# Patient Record
Sex: Male | Born: 1945 | Race: Black or African American | Hispanic: No | Marital: Married | Smoking: Former smoker
Health system: Southern US, Community
[De-identification: ages and names within clinical notes are randomized; demographics above are authoritative.]

## PROBLEM LIST (undated history)

## (undated) DIAGNOSIS — E119 Type 2 diabetes mellitus without complications: Secondary | ICD-10-CM

## (undated) DIAGNOSIS — E785 Hyperlipidemia, unspecified: Secondary | ICD-10-CM

## (undated) DIAGNOSIS — K922 Gastrointestinal hemorrhage, unspecified: Secondary | ICD-10-CM

## (undated) DIAGNOSIS — R569 Unspecified convulsions: Secondary | ICD-10-CM

## (undated) DIAGNOSIS — D72819 Decreased white blood cell count, unspecified: Secondary | ICD-10-CM

## (undated) DIAGNOSIS — D709 Neutropenia, unspecified: Secondary | ICD-10-CM

## (undated) DIAGNOSIS — K635 Polyp of colon: Secondary | ICD-10-CM

## (undated) DIAGNOSIS — D649 Anemia, unspecified: Secondary | ICD-10-CM

## (undated) DIAGNOSIS — I10 Essential (primary) hypertension: Secondary | ICD-10-CM

## (undated) DIAGNOSIS — K219 Gastro-esophageal reflux disease without esophagitis: Secondary | ICD-10-CM

## (undated) DIAGNOSIS — G43909 Migraine, unspecified, not intractable, without status migrainosus: Secondary | ICD-10-CM

## (undated) HISTORY — DX: Migraine, unspecified, not intractable, without status migrainosus: G43.909

## (undated) HISTORY — DX: Hyperlipidemia, unspecified: E78.5

## (undated) HISTORY — PX: NO PAST SURGERIES: SHX2092

## (undated) HISTORY — DX: Neutropenia, unspecified: D70.9

## (undated) HISTORY — DX: Type 2 diabetes mellitus without complications: E11.9

## (undated) HISTORY — DX: Unspecified convulsions: R56.9

## (undated) HISTORY — DX: Polyp of colon: K63.5

## (undated) HISTORY — DX: Anemia, unspecified: D64.9

## (undated) HISTORY — DX: Essential (primary) hypertension: I10

## (undated) HISTORY — DX: Gastro-esophageal reflux disease without esophagitis: K21.9

## (undated) HISTORY — DX: Decreased white blood cell count, unspecified: D72.819

## (undated) HISTORY — DX: Gastrointestinal hemorrhage, unspecified: K92.2

---

## 2006-07-06 ENCOUNTER — Ambulatory Visit: Payer: Self-pay | Admitting: Gastroenterology

## 2006-09-26 ENCOUNTER — Emergency Department: Payer: Self-pay | Admitting: Emergency Medicine

## 2012-01-26 ENCOUNTER — Ambulatory Visit: Payer: Self-pay | Admitting: Gastroenterology

## 2012-04-05 ENCOUNTER — Emergency Department: Payer: Self-pay | Admitting: Emergency Medicine

## 2012-04-05 LAB — CK TOTAL AND CKMB (NOT AT ARMC)
CK, Total: 113 U/L (ref 35–232)
CK-MB: 0.7 ng/mL (ref 0.5–3.6)

## 2012-04-05 LAB — COMPREHENSIVE METABOLIC PANEL
Albumin: 4.1 g/dL (ref 3.4–5.0)
Anion Gap: 8 (ref 7–16)
BUN: 11 mg/dL (ref 7–18)
Bilirubin,Total: 0.4 mg/dL (ref 0.2–1.0)
Calcium, Total: 9 mg/dL (ref 8.5–10.1)
Chloride: 100 mmol/L (ref 98–107)
Co2: 29 mmol/L (ref 21–32)
EGFR (Non-African Amer.): >60
Glucose: 207 mg/dL — ABNORMAL HIGH (ref 65–99)
Osmolality: 279 (ref 275–301)
SGOT(AST): 20 U/L (ref 15–37)
Sodium: 137 mmol/L (ref 136–145)
Total Protein: 7.7 g/dL (ref 6.4–8.2)

## 2012-04-05 LAB — URINALYSIS, COMPLETE
Bacteria: NONE SEEN
Bilirubin,UR: NEGATIVE
Glucose,UR: 150 mg/dL (ref 0–75)
Protein: 100
Squamous Epithelial: NONE SEEN

## 2012-04-05 LAB — CBC
HGB: 12.8 g/dL — ABNORMAL LOW (ref 13.0–18.0)
MCH: 27.9 pg (ref 26.0–34.0)
MCV: 85 fL (ref 80–100)
Platelet: 239 10*3/uL (ref 150–440)
WBC: 4.1 10*3/uL (ref 3.8–10.6)

## 2012-06-24 ENCOUNTER — Ambulatory Visit: Payer: Self-pay | Admitting: Internal Medicine

## 2012-10-25 ENCOUNTER — Emergency Department: Payer: Self-pay | Admitting: Emergency Medicine

## 2012-10-25 LAB — COMPREHENSIVE METABOLIC PANEL
Alkaline Phosphatase: 87 U/L (ref 50–136)
BUN: 14 mg/dL (ref 7–18)
Calcium, Total: 9.2 mg/dL (ref 8.5–10.1)
Chloride: 106 mmol/L (ref 98–107)
Co2: 26 mmol/L (ref 21–32)
Creatinine: 0.88 mg/dL (ref 0.60–1.30)
EGFR (African American): >60
EGFR (Non-African Amer.): >60
Potassium: 3.9 mmol/L (ref 3.5–5.1)
SGOT(AST): 30 U/L (ref 15–37)
SGPT (ALT): 34 U/L (ref 12–78)

## 2012-10-25 LAB — URINALYSIS, COMPLETE
Bilirubin,UR: NEGATIVE
Ketone: NEGATIVE
Leukocyte Esterase: NEGATIVE
Nitrite: NEGATIVE
Ph: 6 (ref 4.5–8.0)
RBC,UR: <1 /HPF (ref 0–5)
Squamous Epithelial: NONE SEEN
WBC UR: 2 /HPF (ref 0–5)

## 2012-10-25 LAB — CK TOTAL AND CKMB (NOT AT ARMC)
CK, Total: 158 U/L (ref 35–232)
CK-MB: 0.6 ng/mL (ref 0.5–3.6)

## 2012-10-25 LAB — CBC
HCT: 39.1 % — ABNORMAL LOW (ref 40.0–52.0)
HGB: 13.1 g/dL (ref 13.0–18.0)
MCV: 84 fL (ref 80–100)
Platelet: 273 10*3/uL (ref 150–440)
RDW: 13.1 % (ref 11.5–14.5)

## 2012-11-06 ENCOUNTER — Ambulatory Visit: Payer: Self-pay | Admitting: Neurology

## 2012-11-06 LAB — CREATININE, SERUM
Creatinine: 1 mg/dL (ref 0.60–1.30)
EGFR (African American): >60
EGFR (Non-African Amer.): >60

## 2014-02-13 DIAGNOSIS — I10 Essential (primary) hypertension: Secondary | ICD-10-CM | POA: Insufficient documentation

## 2014-02-13 DIAGNOSIS — K635 Polyp of colon: Secondary | ICD-10-CM | POA: Insufficient documentation

## 2014-02-13 DIAGNOSIS — E7800 Pure hypercholesterolemia, unspecified: Secondary | ICD-10-CM | POA: Insufficient documentation

## 2014-02-13 DIAGNOSIS — K219 Gastro-esophageal reflux disease without esophagitis: Secondary | ICD-10-CM | POA: Insufficient documentation

## 2014-02-13 DIAGNOSIS — G43909 Migraine, unspecified, not intractable, without status migrainosus: Secondary | ICD-10-CM | POA: Insufficient documentation

## 2014-02-13 DIAGNOSIS — D649 Anemia, unspecified: Secondary | ICD-10-CM | POA: Insufficient documentation

## 2014-02-13 DIAGNOSIS — E119 Type 2 diabetes mellitus without complications: Secondary | ICD-10-CM | POA: Insufficient documentation

## 2014-12-26 ENCOUNTER — Ambulatory Visit: Payer: Self-pay | Admitting: Internal Medicine

## 2015-01-09 ENCOUNTER — Ambulatory Visit
Admit: 2015-01-09 | Disposition: A | Discharge status: Home / Self Care | Payer: Self-pay | Attending: Internal Medicine | Admitting: Internal Medicine

## 2015-02-09 ENCOUNTER — Ambulatory Visit
Admit: 2015-02-09 | Disposition: A | Discharge status: Home / Self Care | Payer: Self-pay | Attending: Internal Medicine | Admitting: Internal Medicine

## 2015-05-10 ENCOUNTER — Other Ambulatory Visit: Payer: Self-pay

## 2015-05-10 DIAGNOSIS — D72819 Decreased white blood cell count, unspecified: Secondary | ICD-10-CM

## 2015-05-11 ENCOUNTER — Inpatient Hospital Stay: Payer: Medicare Other | Attending: Internal Medicine

## 2015-09-11 ENCOUNTER — Inpatient Hospital Stay: Payer: Medicare Other | Attending: Internal Medicine

## 2015-12-14 DIAGNOSIS — F0280 Dementia in other diseases classified elsewhere without behavioral disturbance: Secondary | ICD-10-CM | POA: Insufficient documentation

## 2015-12-14 DIAGNOSIS — G301 Alzheimer's disease with late onset: Secondary | ICD-10-CM

## 2016-01-04 ENCOUNTER — Encounter: Payer: Self-pay | Admitting: *Deleted

## 2016-01-04 ENCOUNTER — Other Ambulatory Visit: Payer: Self-pay | Admitting: *Deleted

## 2016-01-04 DIAGNOSIS — D72819 Decreased white blood cell count, unspecified: Secondary | ICD-10-CM

## 2016-01-09 ENCOUNTER — Inpatient Hospital Stay: Payer: Medicare Other | Admitting: Internal Medicine

## 2016-01-09 ENCOUNTER — Ambulatory Visit: Payer: Self-pay | Admitting: Internal Medicine

## 2016-01-09 ENCOUNTER — Inpatient Hospital Stay: Payer: Medicare Other

## 2016-01-18 ENCOUNTER — Other Ambulatory Visit: Payer: Self-pay | Admitting: Internal Medicine

## 2016-01-18 DIAGNOSIS — R634 Abnormal weight loss: Secondary | ICD-10-CM

## 2016-01-24 ENCOUNTER — Ambulatory Visit: Payer: Medicare Other | Admitting: Internal Medicine

## 2016-01-24 ENCOUNTER — Other Ambulatory Visit: Payer: Medicare Other

## 2016-01-29 ENCOUNTER — Ambulatory Visit: Admission: RE | Admit: 2016-01-29 | Payer: Medicare Other | Source: Ambulatory Visit

## 2016-01-29 ENCOUNTER — Ambulatory Visit
Admission: RE | Admit: 2016-01-29 | Discharge: 2016-01-29 | Disposition: A | Discharge status: Home / Self Care | Payer: Medicare Other | Source: Ambulatory Visit | Attending: Internal Medicine | Admitting: Internal Medicine

## 2016-01-29 DIAGNOSIS — K5730 Diverticulosis of large intestine without perforation or abscess without bleeding: Secondary | ICD-10-CM | POA: Diagnosis not present

## 2016-01-29 DIAGNOSIS — K760 Fatty (change of) liver, not elsewhere classified: Secondary | ICD-10-CM | POA: Diagnosis not present

## 2016-01-29 DIAGNOSIS — R634 Abnormal weight loss: Secondary | ICD-10-CM | POA: Insufficient documentation

## 2016-01-29 MED ORDER — IOPAMIDOL (ISOVUE-300) INJECTION 61%
100.0000 mL | Freq: Once | INTRAVENOUS | Status: AC | PRN
  Administered 2016-01-29: 100 mL via INTRAVENOUS

## 2016-07-09 ENCOUNTER — Ambulatory Visit: Payer: Medicare Other | Attending: Specialist

## 2016-07-09 DIAGNOSIS — G4733 Obstructive sleep apnea (adult) (pediatric): Secondary | ICD-10-CM | POA: Diagnosis not present

## 2016-07-09 DIAGNOSIS — R400 Somnolence: Secondary | ICD-10-CM | POA: Diagnosis present

## 2016-07-09 DIAGNOSIS — G4761 Periodic limb movement disorder: Secondary | ICD-10-CM | POA: Insufficient documentation

## 2017-01-16 ENCOUNTER — Encounter: Payer: Self-pay | Admitting: Dietician

## 2017-01-16 ENCOUNTER — Encounter: Payer: Medicare Other | Attending: Neurology | Admitting: Dietician

## 2017-01-16 VITALS — Ht 72.0 in | Wt 176.2 lb

## 2017-01-16 DIAGNOSIS — R569 Unspecified convulsions: Secondary | ICD-10-CM | POA: Insufficient documentation

## 2017-01-16 DIAGNOSIS — E119 Type 2 diabetes mellitus without complications: Secondary | ICD-10-CM | POA: Insufficient documentation

## 2017-01-16 DIAGNOSIS — K922 Gastrointestinal hemorrhage, unspecified: Secondary | ICD-10-CM | POA: Insufficient documentation

## 2017-01-16 DIAGNOSIS — Z713 Dietary counseling and surveillance: Secondary | ICD-10-CM | POA: Diagnosis not present

## 2017-01-16 NOTE — Patient Instructions (Signed)
   Try Mrs. Dash seasonings on meats or other foods.   Use menus and food lists to plan balanced meals. Include some protein food + a small amount of a starchy food + a vegetable with each meal.

## 2017-01-16 NOTE — Progress Notes (Signed)
Medical Nutrition Therapy: Visit start time: 1315  end time: 1430  Assessment:  Diagnosis: diabetes Past medical history: HTN, gout Psychosocial issues/ stress concerns: memory loss, possible Alzheimer's disease Preferred learning method:  . Auditory  Current weight: 176.2lbs with shoes  Height: 6'0" Medications, supplements: reconciled list in medical record.  Progress and evaluation: wife reports BGs increased significantly since 10/2016. She also reports that he had cortisone injections in both shoulders around that time. She is testing his BGs daily, and reports some readings as high as 350mg /dl. Patient has lost about 11lbs in the past several months. Wife states that he sleeps almost constantly and wakes up only to eat, but does not like many foods that he used to eat. Patient did become drowsy during today's visit.    Physical activity: none  Dietary Intake:  Usual eating pattern includes 2 meals and 2 snacks per day. Dining out frequency: 7 meals per week.  Breakfast: sugar free jello and ritz peanut butter crackers (package) with meds Snack: none Lunch: fried eggs and chicken sausage, oatmeal, sometimes cranberry sauce Snack: Boost drink, 2 sugar free cookies Supper: grilled or baked chicken or baked fish, slaw (won't eat green beans wife brings home).Sometimes beets. KW, or [employer], or [employer] Snack: sometimes eats fried chicken during the night if he wakes up (then BG higher the next morning) Beverages: water,diet ginger ale, sugar free canberry juice  Nutrition Care Education: Topics covered: diabetes, basic nutrition Basic nutrition: basic food groups, appropriate nutrient balance, appropriate meal and snack schedule, general nutrition guidelines    Diabetes: appropriate meal and snack schedule, appropriate carb intake and balance, importance of protein sources. Instructed on basic meal planning using plate method and menus. Wrote menus based on patient's current intake,  advised adequate carbohydrate as well as protein with every meal. Advised eating every 3-5 hours.     Nutritional Diagnosis:  [place]-2.2 Altered nutrition-related laboratory As related to diabetes.  As evidenced by hyperglycemia, spouse report.  Intervention: Instruction as noted above.   Set goals for balanced meals, encouraged use of sample menus to plan meals.    Patient declined follow-up at this time, but wife will work to [employer] Corporationimplement menu options and schedule later if needed.   Education Materials given:  . General diet guidelines for Diabetes . Plate Planner with food lists . Sample meal pattern/ menus . Goals/ instructions  Learner/ who was taught:  . Patient  . Spouse/ partner  Level of understanding: Marland Kitchen. Verbalizes/ demonstrates competency (wife)  Demonstrated degree of understanding via:   Teach back Learning barriers: Memory loss  Willingness to learn/ readiness for change: . Eager, change in progress  Monitoring and Evaluation:  Dietary intake, BG control, and body weight      follow up: prn

## 2017-02-12 ENCOUNTER — Telehealth: Payer: Self-pay | Admitting: Dietician

## 2017-02-12 NOTE — Telephone Encounter (Signed)
Called patient and wife to check on his progress, with preventing further weight loss and adequate nutrition. Left voicemail message requesting a call back.

## 2017-02-19 ENCOUNTER — Encounter: Payer: Self-pay | Admitting: Dietician

## 2017-02-19 NOTE — Progress Notes (Signed)
Have not at this time heard back from Mr. or Mrs. Newstrom since phone message was left on 02/12/17. No follow up appointment was scheduled at his initial visit.

## 2017-03-08 ENCOUNTER — Inpatient Hospital Stay: Payer: Medicare Other

## 2017-03-08 ENCOUNTER — Emergency Department: Payer: Medicare Other

## 2017-03-08 ENCOUNTER — Encounter: Payer: Self-pay | Admitting: Radiology

## 2017-03-08 ENCOUNTER — Inpatient Hospital Stay
Admission: EM | Admit: 2017-03-08 | Discharge: 2017-03-10 | DRG: 176 | Disposition: A | Discharge status: Home Health | Payer: Medicare Other | Attending: Internal Medicine | Admitting: Internal Medicine

## 2017-03-08 DIAGNOSIS — R1011 Right upper quadrant pain: Secondary | ICD-10-CM | POA: Diagnosis present

## 2017-03-08 DIAGNOSIS — E785 Hyperlipidemia, unspecified: Secondary | ICD-10-CM | POA: Diagnosis present

## 2017-03-08 DIAGNOSIS — Z79899 Other long term (current) drug therapy: Secondary | ICD-10-CM | POA: Diagnosis not present

## 2017-03-08 DIAGNOSIS — I10 Essential (primary) hypertension: Secondary | ICD-10-CM | POA: Diagnosis present

## 2017-03-08 DIAGNOSIS — N400 Enlarged prostate without lower urinary tract symptoms: Secondary | ICD-10-CM | POA: Diagnosis present

## 2017-03-08 DIAGNOSIS — N3000 Acute cystitis without hematuria: Secondary | ICD-10-CM | POA: Diagnosis present

## 2017-03-08 DIAGNOSIS — Z87891 Personal history of nicotine dependence: Secondary | ICD-10-CM

## 2017-03-08 DIAGNOSIS — R079 Chest pain, unspecified: Secondary | ICD-10-CM

## 2017-03-08 DIAGNOSIS — Z7982 Long term (current) use of aspirin: Secondary | ICD-10-CM | POA: Diagnosis not present

## 2017-03-08 DIAGNOSIS — F0390 Unspecified dementia without behavioral disturbance: Secondary | ICD-10-CM | POA: Diagnosis present

## 2017-03-08 DIAGNOSIS — I2699 Other pulmonary embolism without acute cor pulmonale: Secondary | ICD-10-CM | POA: Diagnosis not present

## 2017-03-08 DIAGNOSIS — E119 Type 2 diabetes mellitus without complications: Secondary | ICD-10-CM | POA: Diagnosis present

## 2017-03-08 DIAGNOSIS — I471 Supraventricular tachycardia: Secondary | ICD-10-CM | POA: Diagnosis not present

## 2017-03-08 DIAGNOSIS — Z86010 Personal history of colonic polyps: Secondary | ICD-10-CM

## 2017-03-08 DIAGNOSIS — G40909 Epilepsy, unspecified, not intractable, without status epilepticus: Secondary | ICD-10-CM | POA: Diagnosis present

## 2017-03-08 DIAGNOSIS — Z833 Family history of diabetes mellitus: Secondary | ICD-10-CM | POA: Diagnosis not present

## 2017-03-08 DIAGNOSIS — Z7984 Long term (current) use of oral hypoglycemic drugs: Secondary | ICD-10-CM

## 2017-03-08 DIAGNOSIS — K219 Gastro-esophageal reflux disease without esophagitis: Secondary | ICD-10-CM | POA: Diagnosis present

## 2017-03-08 DIAGNOSIS — Z791 Long term (current) use of non-steroidal anti-inflammatories (NSAID): Secondary | ICD-10-CM

## 2017-03-08 DIAGNOSIS — D649 Anemia, unspecified: Secondary | ICD-10-CM | POA: Diagnosis present

## 2017-03-08 LAB — CBC
HEMATOCRIT: 29.8 % — AB (ref 40.0–52.0)
HEMOGLOBIN: 10 g/dL — AB (ref 13.0–18.0)
MCH: 28.8 pg (ref 26.0–34.0)
MCHC: 33.7 g/dL (ref 32.0–36.0)
MCV: 85.5 fL (ref 80.0–100.0)
Platelets: 345 10*3/uL (ref 150–440)
RBC: 3.49 MIL/uL — ABNORMAL LOW (ref 4.40–5.90)
RDW: 13.7 % (ref 11.5–14.5)
WBC: 11.3 10*3/uL — ABNORMAL HIGH (ref 3.8–10.6)

## 2017-03-08 LAB — COMPREHENSIVE METABOLIC PANEL
ALBUMIN: 3.8 g/dL (ref 3.5–5.0)
ALK PHOS: 59 U/L (ref 38–126)
ALT: 16 U/L — ABNORMAL LOW (ref 17–63)
ANION GAP: 12 (ref 5–15)
AST: 21 U/L (ref 15–41)
BUN: 13 mg/dL (ref 6–20)
CALCIUM: 9.3 mg/dL (ref 8.9–10.3)
CHLORIDE: 107 mmol/L (ref 101–111)
CO2: 18 mmol/L — AB (ref 22–32)
Creatinine, Ser: 1.12 mg/dL (ref 0.61–1.24)
GFR calc Af Amer: >60 mL/min (ref >60)
GFR calc non Af Amer: >60 mL/min (ref >60)
GLUCOSE: 187 mg/dL — AB (ref 65–99)
POTASSIUM: 3.6 mmol/L (ref 3.5–5.1)
SODIUM: 137 mmol/L (ref 135–145)
Total Bilirubin: 0.7 mg/dL (ref 0.3–1.2)
Total Protein: 7.6 g/dL (ref 6.5–8.1)

## 2017-03-08 LAB — LIPASE, BLOOD: LIPASE: 30 U/L (ref 11–51)

## 2017-03-08 LAB — URINALYSIS, COMPLETE (UACMP) WITH MICROSCOPIC
BACTERIA UA: NONE SEEN
Bilirubin Urine: NEGATIVE
Glucose, UA: NEGATIVE mg/dL
Hgb urine dipstick: NEGATIVE
Ketones, ur: NEGATIVE mg/dL
Leukocytes, UA: NEGATIVE
Nitrite: NEGATIVE
PROTEIN: 30 mg/dL — AB
Specific Gravity, Urine: >1.046 — ABNORMAL HIGH (ref 1.005–1.030)
pH: 5 (ref 5.0–8.0)

## 2017-03-08 LAB — GLUCOSE, CAPILLARY
GLUCOSE-CAPILLARY: 138 mg/dL — AB (ref 65–99)
GLUCOSE-CAPILLARY: 123 mg/dL — AB (ref 65–99)
GLUCOSE-CAPILLARY: 143 mg/dL — AB (ref 65–99)

## 2017-03-08 LAB — HEPARIN LEVEL (UNFRACTIONATED): Heparin Unfractionated: 0.52 IU/mL (ref 0.30–0.70)

## 2017-03-08 LAB — PROTIME-INR
INR: 1.17
PROTHROMBIN TIME: 15 s (ref 11.4–15.2)

## 2017-03-08 LAB — APTT: aPTT: 41 seconds — ABNORMAL HIGH (ref 24–36)

## 2017-03-08 LAB — TROPONIN I: Troponin I: <0.03 ng/mL (ref <0.03)

## 2017-03-08 MED ORDER — IOPAMIDOL (ISOVUE-300) INJECTION 61%
100.0000 mL | Freq: Once | INTRAVENOUS | Status: AC | PRN
  Administered 2017-03-08: 100 mL via INTRAVENOUS

## 2017-03-08 MED ORDER — IOPAMIDOL (ISOVUE-300) INJECTION 61%
30.0000 mL | Freq: Once | INTRAVENOUS | Status: AC
  Administered 2017-03-08: 30 mL via ORAL

## 2017-03-08 MED ORDER — CEFTRIAXONE SODIUM-DEXTROSE 1-3.74 GM-% IV SOLR
1.0000 g | INTRAVENOUS | Status: DC
  Administered 2017-03-08: 1 g via INTRAVENOUS
  Filled 2017-03-08: qty 50

## 2017-03-08 MED ORDER — HEPARIN (PORCINE) IN NACL 100-0.45 UNIT/ML-% IJ SOLN
1450.0000 [IU]/h | INTRAMUSCULAR | Status: DC
  Administered 2017-03-08 – 2017-03-09 (×2): 1450 [IU]/h via INTRAVENOUS
  Filled 2017-03-08 (×4): qty 250

## 2017-03-08 MED ORDER — LEVETIRACETAM 250 MG PO TABS
250.0000 mg | ORAL_TABLET | Freq: Two times a day (BID) | ORAL | Status: DC
  Administered 2017-03-08 – 2017-03-10 (×5): 250 mg via ORAL
  Filled 2017-03-08 (×6): qty 1

## 2017-03-08 MED ORDER — HEPARIN BOLUS VIA INFUSION
5000.0000 [IU] | Freq: Once | INTRAVENOUS | Status: AC
Start: 2017-03-08 — End: 2017-03-08
  Administered 2017-03-08: 5000 [IU] via INTRAVENOUS
  Filled 2017-03-08: qty 5000

## 2017-03-08 MED ORDER — LISINOPRIL 10 MG PO TABS
20.0000 mg | ORAL_TABLET | Freq: Every day | ORAL | Status: DC
  Administered 2017-03-08 – 2017-03-09 (×2): 20 mg via ORAL
  Filled 2017-03-08 (×2): qty 2

## 2017-03-08 MED ORDER — MEMANTINE HCL 5 MG PO TABS
10.0000 mg | ORAL_TABLET | Freq: Two times a day (BID) | ORAL | Status: DC
  Administered 2017-03-08 – 2017-03-10 (×5): 10 mg via ORAL
  Filled 2017-03-08 (×5): qty 2

## 2017-03-08 MED ORDER — DONEPEZIL HCL 5 MG PO TABS
10.0000 mg | ORAL_TABLET | Freq: Every morning | ORAL | Status: DC
  Administered 2017-03-08 – 2017-03-10 (×3): 10 mg via ORAL
  Filled 2017-03-08 (×3): qty 2

## 2017-03-08 MED ORDER — ACETAMINOPHEN 650 MG RE SUPP: 650.0000 mg | Freq: Four times a day (QID) | RECTAL | Status: DC | PRN

## 2017-03-08 MED ORDER — QUETIAPINE FUMARATE 25 MG PO TABS
25.0000 mg | ORAL_TABLET | Freq: Every day | ORAL | Status: DC
  Administered 2017-03-08 – 2017-03-09 (×2): 25 mg via ORAL
  Filled 2017-03-08 (×2): qty 1

## 2017-03-08 MED ORDER — SODIUM CHLORIDE 0.9 % IV SOLN
INTRAVENOUS | Status: DC
  Administered 2017-03-08: 11:00:00 via INTRAVENOUS

## 2017-03-08 MED ORDER — ESCITALOPRAM OXALATE 10 MG PO TABS
10.0000 mg | ORAL_TABLET | Freq: Every day | ORAL | Status: DC
  Administered 2017-03-09: 10 mg via ORAL
  Filled 2017-03-08 (×3): qty 1

## 2017-03-08 MED ORDER — INSULIN ASPART 100 UNIT/ML ~~LOC~~ SOLN
0.0000 [IU] | Freq: Three times a day (TID) | SUBCUTANEOUS | Status: DC
  Administered 2017-03-08 (×2): 1 [IU] via SUBCUTANEOUS
  Administered 2017-03-09: 3 [IU] via SUBCUTANEOUS
  Administered 2017-03-09: 1 [IU] via SUBCUTANEOUS
  Administered 2017-03-09 – 2017-03-10 (×2): 2 [IU] via SUBCUTANEOUS
  Administered 2017-03-10: 3 [IU] via SUBCUTANEOUS
  Filled 2017-03-08: qty 3
  Filled 2017-03-08: qty 1
  Filled 2017-03-08: qty 3
  Filled 2017-03-08: qty 1
  Filled 2017-03-08: qty 2
  Filled 2017-03-08: qty 1
  Filled 2017-03-08: qty 2

## 2017-03-08 MED ORDER — CEFTRIAXONE SODIUM 1 G IJ SOLR
1.0000 g | INTRAMUSCULAR | Status: DC
  Administered 2017-03-09 – 2017-03-10 (×2): 1 g via INTRAVENOUS
  Filled 2017-03-08 (×4): qty 10

## 2017-03-08 MED ORDER — INSULIN ASPART 100 UNIT/ML ~~LOC~~ SOLN: 0.0000 [IU] | Freq: Every day | SUBCUTANEOUS | Status: DC

## 2017-03-08 MED ORDER — SODIUM CHLORIDE 0.9 % IV BOLUS (SEPSIS)
500.0000 mL | Freq: Once | INTRAVENOUS | Status: AC
  Administered 2017-03-08: 500 mL via INTRAVENOUS

## 2017-03-08 MED ORDER — TAMSULOSIN HCL 0.4 MG PO CAPS
0.4000 mg | ORAL_CAPSULE | Freq: Every day | ORAL | Status: DC
  Administered 2017-03-08 – 2017-03-09 (×2): 0.4 mg via ORAL
  Filled 2017-03-08 (×2): qty 1

## 2017-03-08 MED ORDER — FINASTERIDE 5 MG PO TABS
5.0000 mg | ORAL_TABLET | Freq: Every day | ORAL | Status: DC
  Administered 2017-03-08 – 2017-03-10 (×3): 5 mg via ORAL
  Filled 2017-03-08 (×3): qty 1

## 2017-03-08 MED ORDER — ACETAMINOPHEN 325 MG PO TABS
650.0000 mg | ORAL_TABLET | Freq: Four times a day (QID) | ORAL | Status: DC | PRN
  Administered 2017-03-08: 650 mg via ORAL
  Filled 2017-03-08: qty 2

## 2017-03-08 MED ORDER — CEFTRIAXONE SODIUM-DEXTROSE 1-3.74 GM-% IV SOLR
INTRAVENOUS | Status: AC
  Administered 2017-03-08: 1 g via INTRAVENOUS
  Filled 2017-03-08: qty 50

## 2017-03-08 MED ORDER — HEPARIN BOLUS VIA INFUSION
4000.0000 [IU] | Freq: Once | INTRAVENOUS | Status: DC
  Filled 2017-03-08: qty 4000

## 2017-03-08 MED ORDER — DEXTROSE 5 % IV SOLN: 1.0000 g | INTRAVENOUS | Status: DC

## 2017-03-08 NOTE — Progress Notes (Signed)
ANTICOAGULATION CONSULT NOTE - Initial Consult  Pharmacy Consult for Heparin Indication: pulmonary embolus  No Known Allergies  Patient Measurements: Height: 6' (182.9 cm) Weight: 187 lb (84.8 kg) IBW/kg (Calculated) : 77.6 Heparin Dosing Weight: 84.8 kg  Vital Signs: Temp: 98.7 F (37.1 C) (04/29 0145) Temp Source: Oral (04/29 0145) BP: 155/96 (04/29 0733) Pulse Rate: 85 (04/29 0733)  Labs:  Recent Labs  03/08/17 0144  HGB 10.0*  HCT 29.8*  PLT 345  CREATININE 1.12    Estimated Creatinine Clearance: 66.4 mL/min (by C-G formula based on SCr of 1.12 mg/dL).   Medical History: Past Medical History:  Diagnosis Date  . Chronic anemia   . Colon polyp   . DM (diabetes mellitus) (HCC)   . GERD (gastroesophageal reflux disease)   . GI bleed   . Hyperlipidemia   . Hypertension   . Leukopenia   . Migraine   . Neutropenia (HCC)   . Seizure [facility])    Assessment: 71 y/o M admitted with abdominal pain due to PE. No anticoagulants PTA.   Goal of Therapy:  Heparin level 0.3-0.7 units/ml Monitor platelets by anticoagulation protocol: Yes   Plan:  Give 5000 units bolus x 1 Start heparin infusion at 1450 units/hr Check anti-Xa level in 8 hours and daily while on heparin Continue to monitor H&H and platelets  Neysa Bonito, Kiley Solimine D 03/08/2017,7:48 AM

## 2017-03-08 NOTE — H&P (Signed)
[facility] [place] [facility]   PATIENT NAME: Ronald Davidson    MR#:  [id number]  DATE OF BIRTH:  [birth year]  DATE OF ADMISSION:  03/08/2017  PRIMARY CARE PHYSICIAN: SPARKS,JEFFREY D, MD   REQUESTING/REFERRING PHYSICIAN: Dr Zenda Alpers  CHIEF COMPLAINT:  Abdominal pain and chest pain.  HISTORY OF PRESENT ILLNESS:  Ronald Davidson  is a 71 y.o. male with a known history of Dementia. He is not the best historian. As per the wife he has been having some soreness on his right side of his body starting on Wednesday last week. He tried to cough but couldn't. He was having a little bit of a sore throat. On Friday he was doing a little bit worse and came into urgent care and he is given an antibiotic for possible urinary tract infection. On Saturday he started getting worse and worse and could not even get comfortable. They could not even touch them secondary to pain. They brought him into the emergency room. A CT scan of the abdomen and pelvis showed a filling defect in the right lower lobe in which the radiologist called it a pulmonary embolism. Hospitalist services were contacted for further evaluation.  PAST MEDICAL HISTORY:   Past Medical History:  Diagnosis Date  . Chronic anemia   . Colon polyp   . DM (diabetes mellitus) (HCC)   . GERD (gastroesophageal reflux disease)   . GI bleed   . Hyperlipidemia   . Hypertension   . Leukopenia   . Migraine   . Neutropenia (HCC)   . Seizure (HCC)     PAST SURGICAL HISTORY:   Past Surgical History:  Procedure Laterality Date  . NO PAST SURGERIES      SOCIAL HISTORY:   Social History  Substance Use Topics  . Smoking status: Former Smoker    Packs/day: 0.50    Years: 30.00    Types: Cigarettes    Quit date: 01/17/1999  . Smokeless tobacco: Never Used  . Alcohol use No    FAMILY HISTORY:   Family History  Problem Relation Age of Onset  . Colon cancer Mother   . Diabetes Father     DRUG  ALLERGIES:  No Known Allergies  REVIEW OF SYSTEMS:  CONSTITUTIONAL: No fever, Positive for fatigue and weakness. Positive for 4 pound weight gain EYES: No blurred or double vision. Does not wear glasses EARS, NOSE, AND THROAT: No tinnitus or ear pain. Wednesday had a sore throat. RESPIRATORY: Positive for cough, no shortness of breath, wheezing or hemoptysis.  CARDIOVASCULAR: Positive for chest pain, no orthopnea, edema.  GASTROINTESTINAL: Positive for nausea. No vomiting, diarrhea. Positive for abdominal pain. No blood in bowel movements GENITOURINARY: No dysuria, hematuria.  ENDOCRINE: No polyuria, nocturia,  HEMATOLOGY: No anemia, easy bruising or bleeding SKIN: No rash or lesion. MUSCULOSKELETAL: Positive for joint pain.   NEUROLOGIC: No tingling, numbness, weakness.  PSYCHIATRY: No anxiety or depression.   MEDICATIONS AT HOME:   Prior to Admission medications   Medication Sig Start Date End Date Taking? Authorizing Provider  aspirin EC 81 MG tablet Take by mouth.    Historical Provider, MD  colchicine 0.6 MG tablet Take 2 tablets (1.2mg ) by mouth at first sign of gout flare followed by 1 tablet (0.6mg ) after 1 hour. (Max 1.8mg  within 1 hour) 04/05/15   Historical Provider, MD  cyanocobalamin (,VITAMIN B-12,) 1000 MCG/ML injection INJECT 1 CC INTRAMUSCULARLY ONCE A MONTH 07/13/15   Historical Provider, MD  donepezil (ARICEPT) 10 MG  tablet Take by mouth. 12/07/15   Historical Provider, MD  escitalopram (LEXAPRO) 10 MG tablet TAKE 1 TABLET BY MOUTH EVERY DAY 06/02/16   Historical Provider, MD  finasteride (PROSCAR) 5 MG tablet TAKE 1 TABLET BY MOUTH ONCE A DAY 07/06/16   Historical Provider, MD  glucose blood (ACCU-CHEK COMPACT PLUS) test strip USE TO TEST EVERY 4 TO 6 HOURS AS DIRECTED ( 4 TIMES A DAY) 10/08/15   Historical Provider, MD  levETIRAcetam (KEPPRA) 250 MG tablet Take by mouth. 12/07/15   Historical Provider, MD  meloxicam (MOBIC) 7.5 MG tablet Take by mouth. 05/27/16 05/27/17   Historical Provider, MD  memantine (NAMENDA) 10 MG tablet Take 10 mg by mouth 2 (two) times daily.    Historical Provider, MD  QUEtiapine (SEROQUEL) 25 MG tablet Take 25 mg by mouth at bedtime.    Historical Provider, MD  sitaGLIPtin-metformin (JANUMET) 50-1000 MG tablet TAKE 1 TABLET BY MOUTH ONCE DAILY. 10/07/15   Historical Provider, MD  SYRINGE-NEEDLE, DISP, 3 ML (B-D 3CC LUER-LOK SYR 25GX1") 25G X 1" 3 ML MISC Inject into the muscle. 02/29/16   Historical Provider, MD    Medication reconciliation still undergoing  VITAL SIGNS:  Blood pressure (!) 155/96, pulse 85, temperature 98.7 F (37.1 C), temperature source Oral, resp. rate 20, height 6' (1.829 m), weight 84.8 kg (187 lb), SpO2 96 %.  PHYSICAL EXAMINATION:  GENERAL:  71 y.o.-year-old patient lying in the bed with no acute distress.  EYES: Pupils equal, round, reactive to light and accommodation. No scleral icterus. Extraocular muscles intact.  HEENT: Head atraumatic, normocephalic. Oropharynx and nasopharynx clear.  NECK:  Supple, no jugular venous distention. No thyroid enlargement, no tenderness.  LUNGS: Decreased breath sounds bilaterally, no wheezing, rales,rhonchi or crepitation. No use of accessory muscles of respiration.  CARDIOVASCULAR: S1, S2 normal. No murmurs, rubs, or gallops.  ABDOMEN: Soft, nontender, nondistended. Bowel sounds present. No organomegaly or mass.  EXTREMITIES: No pedal edema, cyanosis, or clubbing.  NEUROLOGIC: Cranial nerves II through XII are intact. Muscle strength 5/5 in all extremities. Sensation intact. Gait not checked.  PSYCHIATRIC: The patient is alert and answers some yes or no questions.  SKIN: No rash, lesion, or ulcer.   LABORATORY PANEL:   CBC  Recent Labs Lab 03/08/17 0144  WBC 11.3*  HGB 10.0*  HCT 29.8*  PLT 345   ------------------------------------------------------------------------------------------------------------------  Chemistries   Recent Labs Lab  03/08/17 0144  NA 137  K 3.6  CL 107  CO2 18*  GLUCOSE 187*  BUN 13  CREATININE 1.12  CALCIUM 9.3  AST 21  ALT 16*  ALKPHOS 59  BILITOT 0.7   ------------------------------------------------------------------------------------------------------------------   RADIOLOGY:  Ct Abdomen Pelvis W Contrast  Result Date: 03/08/2017 CLINICAL DATA:  71 year old male with abdominal pain and UTI symptoms. EXAM: CT ABDOMEN AND PELVIS WITH CONTRAST TECHNIQUE: Multidetector CT imaging of the abdomen and pelvis was performed using the standard protocol following bolus administration of intravenous contrast. CONTRAST:  ISOVUE-300 IOPAMIDOL (ISOVUE-300) INJECTION 61% COMPARISON:  Abdominal CT dated 01/29/2016 FINDINGS: Lower chest: There is apparent area of intraluminal filling defect involving the posterior basal branches of the right lower lobe pulmonary artery best seen on coronal series 5, image 70 suspicious for pulmonary embolism. Volume averaging of the vessel with adjacent soft tissues or lymph node is not excluded. There is patchy area of streaky and hazy density in the posterior basal segment of the right lower lobe as well as a focal pleural based ground-glass consolidation of the  right lung base along the major fissure. Findings are concerning for pulmonary infarcts however may represent an infectious process, possibly fungal. Correlation with clinical exam and further evaluation with chest CTA or V/Q scan is recommended. Left lung base consolidative changes as well as a 15 x 15 mm focal nodular density in the lingula noted. There is a small right pleural effusion. Coronary vascular calcification primarily involving the LAD and RCA. No intra-abdominal free air or free fluid. Hepatobiliary: No focal liver abnormality is seen. No gallstones, gallbladder wall thickening, or biliary dilatation. Pancreas: Unremarkable. No pancreatic ductal dilatation or surrounding inflammatory changes. Spleen: Normal  in size without focal abnormality. Adrenals/Urinary Tract: The adrenal glands are unremarkable. Stable 2 cm left renal upper pole cyst. There is no hydronephrosis on either side. There is symmetric and uniform enhancement of the kidneys with symmetric uptake and excretion of the contrast. Minimal bilateral perinephric stranding are nonspecific and possibly chronic. Correlation with urinalysis recommended to exclude UTI. The urinary bladder is only partially distended. There is apparent diffuse thickening of the bladder wall which may be partly related to underdistention. Cystitis is not excluded. Correlation with urinalysis recommended. Stomach/Bowel: There is moderate stool throughout the colon. There is scattered colonic diverticula without active inflammatory changes. There is no evidence of bowel obstruction or active inflammation. Normal appendix. Vascular/Lymphatic: There is mild aortoiliac atherosclerotic disease. The origins of the celiac axis, SMA, IMA and the renal arteries remain patent. The IVC appears unremarkable. The SMV, splenic vein, and main portal vein are patent. No portal venous gas identified. There is no adenopathy. Reproductive: The prostate and seminal vesicles are grossly unremarkable. Other: None Musculoskeletal: Degenerative changes of the spine with disc desiccation and vacuum phenomena at L4-L5 and L5-S1. There is disc space narrowing and grade 1 retrolisthesis at L5-S1. No acute fractures. IMPRESSION: 1. Findings suspicious for pulmonary embolus involving the visualized right lower lobe pulmonary artery branch with associated areas of pulmonary infarcts. Further evaluation with CTA of the chest or V/Q scan recommended. Alternatively, but less likely, findings may represent pneumonia, possibly fungal in etiology with associated hilar lymphadenopathy and volume averaging artifact of the right lower lobe pulmonary artery branch. 2. No definite acute intra-abdominal or pelvic pathology.  Mild bilateral perinephric stranding, likely chronic. Correlation with urinalysis recommended to exclude UTI. No hydronephrosis. 3. Colonic diverticulosis. No evidence of bowel obstruction or active inflammation. Normal appendix. These results were called by telephone at the time of interpretation on 03/08/2017 at 6:54 am to Dr. Lucrezia Europe , who verbally acknowledged these results. Electronically Signed   By: Elgie Collard M.D.   On: 03/08/2017 07:00    EKG:   Sinus tachycardia 10 2 bpm left axis deviation and right bundle branch block  IMPRESSION AND PLAN:   1. Right-sided chest and abdominal pain. Suspected pulmonary embolism. Start heparin drip. We'll get a sonogram of the lower extremities and echocardiogram. Tomorrow will repeat a CT scan of the chest to confirm pulmonary embolism. Stop Megace. Gentle IV fluids overnight. 2. Acute cystitis. Send off a urinalysis and urine culture. Switch cefuroxime over to Rocephin while here in the hospital. 3. Dementia on Aricept and Namenda. Seroquel at night 4. Type 2 diabetes mellitus. Hold Glucophage at this time sliding scale. 5. BPH on Flomax and finasteride 6. Seizure disorder on low-dose Keppra 7. Essential hypertension on lisinopril    All the records are reviewed and case discussed with ED provider. Management plans discussed with the patient, family and they are in agreement.  CODE STATUS: full code  TOTAL TIME TAKING CARE OF THIS PATIENT: 50 minutes.    Alford Highland M.D on 03/08/2017 at 8:06 AM  Between 7am to 6pm - Pager - [phone]  After 6pm call admission pager [phone]  [facility]  [phone]  CC: Primary care physician: Dr Osborne Oman

## 2017-03-08 NOTE — ED Triage Notes (Signed)
Reports having cough, pain to right upper abdominal quad.  Reports seen at [facility] yesterday, and given antibiotic for possible UTI.  Tonight reports generalized body aches.  Family reports when laying in the bed he just "hollers".  Patient awake, alert during triage, no acute distress noted.

## 2017-03-08 NOTE — Progress Notes (Signed)
ANTICOAGULATION CONSULT NOTE   Pharmacy Consult for Heparin Indication: pulmonary embolus  No Known Allergies  Patient Measurements: Height: 6' (182.9 cm) Weight: 187 lb (84.8 kg) IBW/kg (Calculated) : 77.6 Heparin Dosing Weight: 84.8 kg  Vital Signs: Temp: 98 F (36.7 C) (04/29 1009) Temp Source: Oral (04/29 1009) BP: 168/77 (04/29 1009) Pulse Rate: 83 (04/29 1009)  Labs:  Recent Labs  03/08/17 0144 03/08/17 0739 03/08/17 1615  HGB 10.0*  --   --   HCT 29.8*  --   --   PLT 345  --   --   APTT  --  41*  --   LABPROT  --  15.0  --   INR  --  1.17  --   HEPARINUNFRC  --   --  0.52  CREATININE 1.12  --   --   TROPONINI  --  <0.03  --     Estimated Creatinine Clearance: 66.4 mL/min (by C-G formula based on SCr of 1.12 mg/dL).   Medical History: Past Medical History:  Diagnosis Date  . Chronic anemia   . Colon polyp   . DM (diabetes mellitus) (HCC)   . GERD (gastroesophageal reflux disease)   . GI bleed   . Hyperlipidemia   . Hypertension   . Leukopenia   . Migraine   . Neutropenia (HCC)   . Seizure [facility])    Assessment: 71 y/o M admitted with abdominal pain due to PE. No anticoagulants PTA. Currently on Heparin drip at 1450 units/hr.  4/29 16:15 Heparin level resulted at 0.52  Goal of Therapy:  Heparin level 0.3-0.7 units/ml Monitor platelets by anticoagulation protocol: Yes   Plan:  Will continue with current rate and check confirmation Heparin level in 8 hours.  Clovia Cuff, PharmD, BCPS 03/08/2017 5:14 PM

## 2017-03-08 NOTE — ED Provider Notes (Signed)
[facility] Emergency Department Provider Note   ____________________________________________   First MD Initiated Contact with Patient 03/08/17 0448     (approximate)  I have reviewed the triage vital signs and the nursing notes.   HISTORY  Chief Complaint Abdominal Pain   HPI Ronald Davidson is a 71 y.o. male who comes into the hospital today with some abdominal pain. According to the patient's significant other he was hollering in complaining of some right-sided abdominal pain. He was nauseous earlier in the day. Whenever he moves around he has some pain in his abdomen. When he lays down the pain seems to worse but when he sits up the pain is improved. The patient was seen at [facility] clinic yesterday with urinary frequency andwas prescribed antibiotics for a possible urinary tract infection. The patient has not had any fevers and has been sore in his chest. The patient denies though some chest pain and shortness of breath. His last bowel movement was yesterday. He reports that he does not have a lot of pain and sitting on the waiting room his pain was not very severe but laying back his pain has returned. The patient is here today for evaluation.   Past Medical History:  Diagnosis Date  . Chronic anemia   . Colon polyp   . DM (diabetes mellitus) (HCC)   . GERD (gastroesophageal reflux disease)   . GI bleed   . Hyperlipidemia   . Hypertension   . Leukopenia   . Migraine   . Neutropenia (HCC)   . Seizure [facility])     Patient Active Problem List   Diagnosis Date Noted  . Seizures (HCC) 01/16/2017  . Upper GI bleeding 01/16/2017  . Late onset Alzheimer's disease without behavioral disturbance 12/14/2015  . Leukopenia 05/10/2015  . Anemia 02/13/2014  . Colon polyp 02/13/2014  . GERD (gastroesophageal reflux disease) 02/13/2014  . HTN (hypertension) 02/13/2014  . Migraine headache 02/13/2014  . Pure hypercholesterolemia 02/13/2014  . Type 2  diabetes mellitus without complication (HCC) 02/13/2014    No past surgical history on file.  Prior to Admission medications   Medication Sig Start Date End Date Taking? Authorizing Provider  aspirin EC 81 MG tablet Take by mouth.    Historical Provider, MD  colchicine 0.6 MG tablet Take 2 tablets (1.2mg ) by mouth at first sign of gout flare followed by 1 tablet (0.6mg ) after 1 hour. (Max 1.8mg  within 1 hour) 04/05/15   Historical Provider, MD  cyanocobalamin (,VITAMIN B-12,) 1000 MCG/ML injection INJECT 1 CC INTRAMUSCULARLY ONCE A MONTH 07/13/15   Historical Provider, MD  donepezil (ARICEPT) 10 MG tablet Take by mouth. 12/07/15   Historical Provider, MD  escitalopram (LEXAPRO) 10 MG tablet TAKE 1 TABLET BY MOUTH EVERY DAY 06/02/16   Historical Provider, MD  finasteride (PROSCAR) 5 MG tablet TAKE 1 TABLET BY MOUTH ONCE A DAY 07/06/16   Historical Provider, MD  glucose blood (ACCU-CHEK COMPACT PLUS) test strip USE TO TEST EVERY 4 TO 6 HOURS AS DIRECTED ( 4 TIMES A DAY) 10/08/15   Historical Provider, MD  levETIRAcetam (KEPPRA) 250 MG tablet Take by mouth. 12/07/15   Historical Provider, MD  meloxicam (MOBIC) 7.5 MG tablet Take by mouth. 05/27/16 05/27/17  Historical Provider, MD  memantine (NAMENDA) 10 MG tablet Take 10 mg by mouth 2 (two) times daily.    Historical Provider, MD  QUEtiapine (SEROQUEL) 25 MG tablet Take 25 mg by mouth at bedtime.    Historical Provider, MD  sitaGLIPtin-metformin (  JANUMET) 50-1000 MG tablet TAKE 1 TABLET BY MOUTH ONCE DAILY. 10/07/15   Historical Provider, MD  SYRINGE-NEEDLE, DISP, 3 ML (B-D 3CC LUER-LOK SYR 25GX1") 25G X 1" 3 ML MISC Inject into the muscle. 02/29/16   Historical Provider, MD    Allergies Patient has no known allergies.  No family history on file.  Social History Social History  Substance Use Topics  . Smoking status: Former Smoker    Packs/day: 0.50    Years: 30.00    Types: Cigarettes    Quit date: 01/17/1999  . Smokeless tobacco: Never Used    . Alcohol use No    Review of Systems  Constitutional: No fever/chills Eyes: No visual changes. ENT: No sore throat. Cardiovascular: Denies chest pain. Respiratory: Denies shortness of breath. Gastrointestinal:  abdominal pain.   nausea, no vomiting.  No diarrhea.  No constipation. Genitourinary: Urinary frequency. Musculoskeletal: Negative for back pain. Skin: Negative for rash. Neurological: Negative for headaches, focal weakness or numbness.   ____________________________________________   PHYSICAL EXAM:  VITAL SIGNS: ED Triage Vitals  Enc Vitals Group     BP 03/08/17 0145 (!) 118/91     Pulse Rate 03/08/17 0145 (!) 102     Resp 03/08/17 0145 20     Temp 03/08/17 0145 98.7 F (37.1 C)     Temp Source 03/08/17 0145 Oral     SpO2 03/08/17 0145 95 %     Weight 03/08/17 0147 187 lb (84.8 kg)     Height 03/08/17 0147 6' (1.829 m)     Head Circumference --      Peak Flow --      Pain Score --      Pain Loc --      Pain Edu? --      Excl. in GC? --    Constitutional: Alert and oriented. Well appearing and in Mild. Eyes: Conjunctivae are normal. PERRL. EOMI. Head: Atraumatic. Nose: No congestion/rhinnorhea. Mouth/Throat: Mucous membranes are moist.  Oropharynx non-erythematous. Cardiovascular: Normal rate, regular rhythm. Grossly normal heart sounds.  Good peripheral circulation. Respiratory: Normal respiratory effort.  No retractions. Lungs CTAB. Gastrointestinal: Soft and nontender. No distention. Positive bowel sounds Musculoskeletal: No lower extremity tenderness nor edema.   Neurologic:  Normal speech and language.  Skin:  Skin is warm, dry and intact.  Psychiatric: Mood and affect are normal.   ____________________________________________   LABS (all labs ordered are listed, but only abnormal results are displayed)  Labs Reviewed  COMPREHENSIVE METABOLIC PANEL - Abnormal; Notable for the following:       Result Value   CO2 18 (*)    Glucose, Bld 187  (*)    ALT 16 (*)    All other components within normal limits  CBC - Abnormal; Notable for the following:    WBC 11.3 (*)    RBC 3.49 (*)    Hemoglobin 10.0 (*)    HCT 29.8 (*)    All other components within normal limits  URINE CULTURE  LIPASE, BLOOD  URINALYSIS, COMPLETE (UACMP) WITH MICROSCOPIC  PROTIME-INR  APTT  TROPONIN I   ____________________________________________  EKG  ED ECG REPORT I, Rebecka Apley, the attending physician, personally viewed and interpreted this ECG.   Date: 03/08/2017  EKG Time: 149  Rate: 102  Rhythm: normal sinus rhythm  Axis: left axis deviation  Intervals:right bundle branch block  ST&T Change: Flipped T waves in lead 3, V3  ____________________________________________  RADIOLOGY  CT abd and pelvis ____________________________________________  PROCEDURES  Procedure(s) performed: None  Procedures  Critical Care performed: No  ____________________________________________   INITIAL IMPRESSION / ASSESSMENT AND PLAN / ED COURSE  Pertinent labs & imaging results that were available during my care of the patient were reviewed by me and considered in my medical decision making (see chart for details).  This is a 71 year old male who comes into the hospital today with some presumed abdominal pain. The patient's significant other reports that he was writhing around in pain prior to coming in yesterday. He reports though that his pain is gone and he feels better. When his wife presses on his belly he does wince when I examine him he does not make any faces and he denies any pain. I will perform a CT scan of this patient. He was told that he may have a urinary tract infection. I will reassess the patient once I received his results.  Clinical Course as of Mar 08 741  Sun Mar 08, 2017  0739 1. Findings suspicious for pulmonary embolus involving the visualized right lower lobe pulmonary artery branch with associated areas of  pulmonary infarcts. Further evaluation with CTA of the chest or V/Q scan recommended. Alternatively, but less likely, findings may represent pneumonia, possibly fungal in etiology with associated hilar lymphadenopathy and volume averaging artifact of the right lower lobe pulmonary artery branch. 2. No definite acute intra-abdominal or pelvic pathology. Mild bilateral perinephric stranding, likely chronic. Correlation with urinalysis recommended to exclude UTI. No hydronephrosis. 3. Colonic diverticulosis. No evidence of bowel obstruction or active inflammation. Normal appendix.   CT Abdomen Pelvis W Contrast [AW]    Clinical Course User Index [AW] Rebecka Apley, MD   It appears that the patient may have a pulmonary embolism. I will start the patient on heparin and admit him to the hospitalist service. He will then receive some fluids and a repeat CT of his chest to determine the extent of this embolism. The patient denies any shortness of breath but has had some cough. He will be admitted to the hospitalist service.  ____________________________________________   FINAL CLINICAL IMPRESSION(S) / ED DIAGNOSES  Final diagnoses:  Other acute pulmonary embolism without acute cor pulmonale (HCC)  Chest pain, unspecified type  Right upper quadrant abdominal pain      NEW MEDICATIONS STARTED DURING THIS VISIT:  New Prescriptions   No medications on file     Note:  This document was prepared using Dragon voice recognition software and may include unintentional dictation errors.    Rebecka Apley, MD 03/08/17 [phone]

## 2017-03-09 ENCOUNTER — Inpatient Hospital Stay: Payer: Medicare Other

## 2017-03-09 ENCOUNTER — Inpatient Hospital Stay
Admit: 2017-03-09 | Discharge: 2017-03-09 | Disposition: A | Discharge status: Home / Self Care | Payer: Medicare Other | Attending: Internal Medicine | Admitting: Internal Medicine

## 2017-03-09 LAB — BASIC METABOLIC PANEL
Anion gap: 11 (ref 5–15)
BUN: 8 mg/dL (ref 6–20)
CHLORIDE: 105 mmol/L (ref 101–111)
CO2: 20 mmol/L — ABNORMAL LOW (ref 22–32)
Calcium: 8.6 mg/dL — ABNORMAL LOW (ref 8.9–10.3)
Creatinine, Ser: 0.81 mg/dL (ref 0.61–1.24)
Glucose, Bld: 147 mg/dL — ABNORMAL HIGH (ref 65–99)
POTASSIUM: 3.3 mmol/L — AB (ref 3.5–5.1)
SODIUM: 136 mmol/L (ref 135–145)

## 2017-03-09 LAB — GLUCOSE, CAPILLARY
GLUCOSE-CAPILLARY: 138 mg/dL — AB (ref 65–99)
GLUCOSE-CAPILLARY: 158 mg/dL — AB (ref 65–99)
GLUCOSE-CAPILLARY: 196 mg/dL — AB (ref 65–99)
Glucose-Capillary: 210 mg/dL — ABNORMAL HIGH (ref 65–99)

## 2017-03-09 LAB — CBC
HEMATOCRIT: 27.4 % — AB (ref 40.0–52.0)
Hemoglobin: 9.2 g/dL — ABNORMAL LOW (ref 13.0–18.0)
MCH: 28.5 pg (ref 26.0–34.0)
MCHC: 33.8 g/dL (ref 32.0–36.0)
MCV: 84.4 fL (ref 80.0–100.0)
PLATELETS: 334 10*3/uL (ref 150–440)
RBC: 3.24 MIL/uL — AB (ref 4.40–5.90)
RDW: 13.6 % (ref 11.5–14.5)
WBC: 8.7 10*3/uL (ref 3.8–10.6)

## 2017-03-09 LAB — HEPARIN LEVEL (UNFRACTIONATED)
HEPARIN UNFRACTIONATED: 0.22 [IU]/mL — AB (ref 0.30–0.70)
Heparin Unfractionated: <0.1 IU/mL — ABNORMAL LOW (ref 0.30–0.70)

## 2017-03-09 LAB — URINE CULTURE

## 2017-03-09 MED ORDER — HEPARIN BOLUS VIA INFUSION
2500.0000 [IU] | Freq: Once | INTRAVENOUS | Status: DC
  Filled 2017-03-09: qty 2500

## 2017-03-09 MED ORDER — APIXABAN 5 MG PO TABS
10.0000 mg | ORAL_TABLET | Freq: Two times a day (BID) | ORAL | Status: DC
  Administered 2017-03-09 – 2017-03-10 (×3): 10 mg via ORAL
  Filled 2017-03-09 (×3): qty 2

## 2017-03-09 MED ORDER — POTASSIUM CHLORIDE CRYS ER 20 MEQ PO TBCR
40.0000 meq | EXTENDED_RELEASE_TABLET | Freq: Once | ORAL | Status: AC
  Administered 2017-03-09: 40 meq via ORAL
  Filled 2017-03-09: qty 2

## 2017-03-09 MED ORDER — AZITHROMYCIN 500 MG PO TABS
500.0000 mg | ORAL_TABLET | Freq: Every day | ORAL | Status: AC
  Administered 2017-03-09: 500 mg via ORAL
  Filled 2017-03-09: qty 1

## 2017-03-09 MED ORDER — IOPAMIDOL (ISOVUE-370) INJECTION 76%
75.0000 mL | Freq: Once | INTRAVENOUS | Status: AC | PRN
  Administered 2017-03-09: 75 mL via INTRAVENOUS

## 2017-03-09 MED ORDER — AZITHROMYCIN 250 MG PO TABS
250.0000 mg | ORAL_TABLET | Freq: Every day | ORAL | Status: DC
  Administered 2017-03-10: 250 mg via ORAL
  Filled 2017-03-09: qty 1

## 2017-03-09 MED ORDER — APIXABAN 5 MG PO TABS: 5.0000 mg | ORAL_TABLET | Freq: Two times a day (BID) | ORAL | Status: DC

## 2017-03-09 MED ORDER — DILTIAZEM HCL ER COATED BEADS 120 MG PO CP24
120.0000 mg | ORAL_CAPSULE | Freq: Every day | ORAL | Status: DC
  Administered 2017-03-09 – 2017-03-10 (×2): 120 mg via ORAL
  Filled 2017-03-09 (×2): qty 1

## 2017-03-09 NOTE — Progress Notes (Signed)
ANTICOAGULATION CONSULT NOTE   Pharmacy Consult for Apixaban  Indication: pulmonary embolus  No Known Allergies  Patient Measurements: Height: 6' (182.9 cm) Weight: 187 lb (84.8 kg) IBW/kg (Calculated) : 77.6 Heparin Dosing Weight: 84.8 kg  Vital Signs: Temp: 98 F (36.7 C) (04/29 1009) Temp Source: Oral (04/29 1009) BP: 168/77 (04/29 1009) Pulse Rate: 83 (04/29 1009)  Labs:  Recent Labs (last 2 labs)    Recent Labs  03/08/17 0144 03/08/17 0739 03/08/17 1615  HGB 10.0*  --   --   HCT 29.8*  --   --   PLT 345  --   --   APTT  --  41*  --   LABPROT  --  15.0  --   INR  --  1.17  --   HEPARINUNFRC  --   --  0.52  CREATININE 1.12  --   --   TROPONINI  --  <0.03  --       Estimated Creatinine Clearance: 66.4 mL/min (by C-G formula based on SCr of 1.12 mg/dL).   Medical History:     Past Medical History:  Diagnosis Date  . Chronic anemia   . Colon polyp   . DM (diabetes mellitus) (HCC)   . GERD (gastroesophageal reflux disease)   . GI bleed   . Hyperlipidemia   . Hypertension   . Leukopenia   . Migraine   . Neutropenia (HCC)   . Seizure [facility])    Assessment: 71 y/o M admitted with abdominal pain due to PE. No anticoagulants PTA. Currently on Heparin drip at 1450 units/hr.  Goal of Therapy:  Monitor platelets by anticoagulation protocol: Yes   Plan:  Will start patient on Apixaban  BID x 7 days, followed by apixaban  BID thereafter.  Heparin drip should be discontinued when 1st dose of Apixaban is given. Nurse has been provided verbal instructions for transition from heparin to apixaban.  Pharmacy to provide counseling to patient prior to discharge.   Gardner Candle, PharmD, BCPS Clinical Pharmacist 03/09/2017 10:24 AM

## 2017-03-09 NOTE — Progress Notes (Signed)
ANTICOAGULATION CONSULT NOTE   Pharmacy Consult for Heparin Indication: pulmonary embolus  No Known Allergies  Patient Measurements: Height: 6' (182.9 cm) Weight: 187 lb (84.8 kg) IBW/kg (Calculated) : 77.6 Heparin Dosing Weight: 84.8 kg  Vital Signs: Temp: 98 F (36.7 C) (04/29 1009) Temp Source: Oral (04/29 1009) BP: 168/77 (04/29 1009) Pulse Rate: 83 (04/29 1009)  Labs:  Recent Labs (last 2 labs)    Recent Labs  03/08/17 0144 03/08/17 0739 03/08/17 1615  HGB 10.0*  --   --   HCT 29.8*  --   --   PLT 345  --   --   APTT  --  41*  --   LABPROT  --  15.0  --   INR  --  1.17  --   HEPARINUNFRC  --   --  0.52  CREATININE 1.12  --   --   TROPONINI  --  <0.03  --       Estimated Creatinine Clearance: 66.4 mL/min (by C-G formula based on SCr of 1.12 mg/dL).   Medical History:     Past Medical History:  Diagnosis Date  . Chronic anemia   . Colon polyp   . DM (diabetes mellitus) (HCC)   . GERD (gastroesophageal reflux disease)   . GI bleed   . Hyperlipidemia   . Hypertension   . Leukopenia   . Migraine   . Neutropenia (HCC)   . Seizure [facility])    Assessment: 71 y/o M admitted with abdominal pain due to PE. No anticoagulants PTA. Currently on Heparin drip at 1450 units/hr.  4/29 16:15 Heparin level resulted at 0.52  Goal of Therapy:  Heparin level 0.3-0.7 units/ml Monitor platelets by anticoagulation protocol: Yes   Plan:  Will continue with current rate and check confirmation Heparin level in 8 hours.  4/30 @ 0000 HL < 0.10 according to RN drip was stopped. Will rebolus patient w/ heparin 2500 units IV x 1 and continue rate at 1450 units/hr since last anti-xa was therapeutic (0.52) and will recheck HL @ 1000.   Thank you for this consult.  Thomasene Ripple, PharmD, BCPS Clinical Pharmacist 03/09/2017

## 2017-03-09 NOTE — Care Management Note (Signed)
Case Management Note  Patient Details  Name: Ronald Davidson MRN: [id number] Date of Birth: 1946-07-06  Subjective/Objective:  Free 30 day coupon given for Eliquis.                  Action/Plan:   Expected Discharge Date:                  Expected Discharge Plan:     In-House Referral:     Discharge planning Services     Post Acute Care Choice:    Choice offered to:     DME Arranged:    DME Agency:     HH Arranged:    HH Agency:     Status of Service:     If discussed at [employer] of Stay Meetings, dates discussed:    Additional Comments:  Marily Memos, RN 03/09/2017, 3:35 PM

## 2017-03-09 NOTE — Plan of Care (Signed)
Problem: Bowel/Gastric: Goal: Will not experience complications related to bowel motility Outcome: Progressing Pt is progressing toward goals.   

## 2017-03-09 NOTE — Progress Notes (Signed)
Shift assessment completed by 0800. Pt is awake, alert and oriented ot self and place.Pt is on room air, lungs are slightly decreased to r base, respirations are unlabored, pt is on room air. Hr is regular, telebox is intact. Abdomen is soft, bs heard. Pt is wearing incontinence brief.ppp,no edema noted. PIV #22 intact to l fa and to rac. rac has heparin infusing per md order, l forearm has ns infusing per md order, both sites are free of redness and swelling. This Clinical research associate received at call from CT advising that pt needs a piv #20 for chest CT. thsi writer placed #20 to pt's lac, pt tolerated well, and pt then left the unit for CT, returning approx 45 minutes later. Pt's wife arrived at bedside, told this writer that pt had been complaining of pain to his R leg for a couple aeeks at home. discussed that PE's can start in pt's legs and break off. Pt is able to feed himself. Dr. Hilton Sinclair in on runds, heparin gtt shut off when pt received eliquisd po, iv ns also dc'd per md order. Pt ambulated ot that bathroom with two assist and gait belt at approx 1300, voided well and returned to bed. Wife at bedside. Call bell in reach.

## 2017-03-09 NOTE — Progress Notes (Signed)
PT Cancellation Note  Patient Details Name: Ronald Davidson MRN: [id number] DOB: Oct 27, 1946   Cancelled Treatment:    Reason Eval/Treat Not Completed: Medical issues which prohibited therapy.  PT consult received.  Chart reviewed.  Suspected PE and heparin drip started yesterday 03/08/17.  CT angio chest performed today and imaging showing confirmed multifocal acute PE.  Per PT guidelines for PE, will hold PT services for 48 hours from time of therapeutic dose of anticoagulation medication given for PE (noted to be 03/08/17 at 0836).  Will re-attempt PT eval tomorrow (03/10/17) after 4098.  Hendricks Limes, PT 03/09/17, 9:29 AM [phone]

## 2017-03-09 NOTE — Progress Notes (Addendum)
Patient ID: Ronald Davidson, male   DOB: 06-May-1946, 71 y.o.   MRN: [id number]  [facility] PROGRESS NOTE  Ronald Davidson WUJ:[id number] DOB: 09/13/1946 DOA: 03/08/2017 PCP: Marguarite Arbour, MD  HPI/Subjective: Patient feeling okay. No chest pain or shortness of breath. Had a fever of 102 last night.  Objective: Vitals:   03/09/17 0725 03/09/17 1435  BP: 128/69 131/72  Pulse: 84 81  Resp:  18  Temp: 98.3 F (36.8 C) 98.3 F (36.8 C)    Filed Weights   03/08/17 0147  Weight: 84.8 kg (187 lb)    ROS: Review of Systems  Constitutional: Positive for fever. Negative for chills.  Eyes: Negative for blurred vision.  Respiratory: Negative for cough and shortness of breath.   Cardiovascular: Negative for chest pain.  Gastrointestinal: Negative for abdominal pain, constipation, diarrhea, nausea and vomiting.  Genitourinary: Negative for dysuria.  Musculoskeletal: Negative for joint pain.  Neurological: Negative for dizziness and headaches.   Exam: Physical Exam  HENT:  Nose: No mucosal edema.  Mouth/Throat: No oropharyngeal exudate or posterior oropharyngeal edema.  Eyes: Conjunctivae, EOM and lids are normal. Pupils are equal, round, and reactive to light.  Neck: No JVD present. Carotid bruit is not present. No edema present. No thyroid mass and no thyromegaly present.  Cardiovascular: S1 normal and S2 normal.  Exam reveals no gallop.   No murmur heard. Pulses:      Dorsalis pedis pulses are 2+ on the right side, and 2+ on the left side.  Respiratory: No respiratory distress. He has no wheezes. He has no rhonchi. He has no rales.  GI: Soft. Bowel sounds are normal. There is no tenderness.  Musculoskeletal:       Right ankle: He exhibits swelling.       Left ankle: He exhibits swelling.  Lymphadenopathy:    He has no cervical adenopathy.  Neurological: He is alert. No cranial nerve deficit.  Skin: Skin is warm. No rash noted. Nails show no clubbing.   Psychiatric: He has a normal mood and affect.      Data Reviewed: Basic Metabolic Panel:  Recent Labs Lab 03/08/17 0144 03/09/17 0037  NA 137 136  K 3.6 3.3*  CL 107 105  CO2 18* 20*  GLUCOSE 187* 147*  BUN 13 8  CREATININE 1.12 0.81  CALCIUM 9.3 8.6*   Liver Function Tests:  Recent Labs Lab 03/08/17 0144  AST 21  ALT 16*  ALKPHOS 59  BILITOT 0.7  PROT 7.6  ALBUMIN 3.8    Recent Labs Lab 03/08/17 0144  LIPASE 30   CBC:  Recent Labs Lab 03/08/17 0144 03/09/17 0037  WBC 11.3* 8.7  HGB 10.0* 9.2*  HCT 29.8* 27.4*  MCV 85.5 84.4  PLT 345 334   Cardiac Enzymes:  Recent Labs Lab 03/08/17 0739  TROPONINI <0.03    CBG:  Recent Labs Lab 03/08/17 1117 03/08/17 1651 03/08/17 2111 03/09/17 0721 03/09/17 1126  GLUCAP 138* 123* 143* 138* 158*    Recent Results (from the past 240 hour(s))  Urine culture     Status: Abnormal   Collection Time: 03/08/17  7:39 AM  Result Value Ref Range Status   Specimen Description URINE, CLEAN CATCH  Final   Special Requests NONE  Final   Culture MULTIPLE SPECIES PRESENT, SUGGEST RECOLLECTION (A)  Final   Report Status 03/09/2017 FINAL  Final     Studies: Ct Angio Chest Pe W Or Wo Contrast  Result Date: 03/09/2017 CLINICAL DATA:  Possible pulmonary  embolism on abdominal CT yesterday. Patient is already on a heparin drip. EXAM: CT ANGIOGRAPHY CHEST WITH CONTRAST TECHNIQUE: Multidetector CT imaging of the chest was performed using the standard protocol during bolus administration of intravenous contrast. Multiplanar CT image reconstructions and MIPs were obtained to evaluate the vascular anatomy. CONTRAST:  75 cc Isovue 370 intravenous COMPARISON:  Abdominal CT from yesterday FINDINGS: Cardiovascular: Multifocal (at least 6) bilateral pulmonary emboli. Some appear eccentric and remote appearing, others central and acute. The largest is a segmental size embolus is seen posteriorly in the right upper lobe. Negative  for right heart strain. No cardiomegaly or pericardial effusion. Atherosclerosis of the aorta and coronaries. Mediastinum/Nodes: Asymmetric right hilar lymphadenopathy with nodes measuring up to 14 mm. Borderline mediastinal adenopathy. No left hilar adenopathy. Lungs/Pleura: Bandlike areas of consolidative opacity in the right more than left lung bases, primarily lower lobes but also in the lingula. These areas show volume loss and bronchial crowding. No pulmonary edema or pleural effusion. Upper Abdomen: No acute finding Musculoskeletal: No acute or aggressive finding Review of the MIP images confirms the above findings. IMPRESSION: 1. Confirmed multifocal acute pulmonary emboli, up to segmental size. There have likely been remote emboli to the lower lobes. Negative for right heart strain. 2. Multifocal lung infarct or atelectasis. 3. Asymmetric right hilar adenopathy of indeterminate cause, correlate for pneumonia symptoms in this patient with asymmetric right-sided lung opacity. Cannot exclude neoplasm, if appropriate for comorbidities three-month follow-up CT could evaluate for resolution or stability. Electronically Signed   By: Marnee Spring M.D.   On: 03/09/2017 09:06   Ct Abdomen Pelvis W Contrast  Result Date: 03/08/2017 CLINICAL DATA:  71 year old male with abdominal pain and UTI symptoms. EXAM: CT ABDOMEN AND PELVIS WITH CONTRAST TECHNIQUE: Multidetector CT imaging of the abdomen and pelvis was performed using the standard protocol following bolus administration of intravenous contrast. CONTRAST:  ISOVUE-300 IOPAMIDOL (ISOVUE-300) INJECTION 61% COMPARISON:  Abdominal CT dated 01/29/2016 FINDINGS: Lower chest: There is apparent area of intraluminal filling defect involving the posterior basal branches of the right lower lobe pulmonary artery best seen on coronal series 5, image 70 suspicious for pulmonary embolism. Volume averaging of the vessel with adjacent soft tissues or lymph node is not  excluded. There is patchy area of streaky and hazy density in the posterior basal segment of the right lower lobe as well as a focal pleural based ground-glass consolidation of the right lung base along the major fissure. Findings are concerning for pulmonary infarcts however may represent an infectious process, possibly fungal. Correlation with clinical exam and further evaluation with chest CTA or V/Q scan is recommended. Left lung base consolidative changes as well as a 15 x 15 mm focal nodular density in the lingula noted. There is a small right pleural effusion. Coronary vascular calcification primarily involving the LAD and RCA. No intra-abdominal free air or free fluid. Hepatobiliary: No focal liver abnormality is seen. No gallstones, gallbladder wall thickening, or biliary dilatation. Pancreas: Unremarkable. No pancreatic ductal dilatation or surrounding inflammatory changes. Spleen: Normal in size without focal abnormality. Adrenals/Urinary Tract: The adrenal glands are unremarkable. Stable 2 cm left renal upper pole cyst. There is no hydronephrosis on either side. There is symmetric and uniform enhancement of the kidneys with symmetric uptake and excretion of the contrast. Minimal bilateral perinephric stranding are nonspecific and possibly chronic. Correlation with urinalysis recommended to exclude UTI. The urinary bladder is only partially distended. There is apparent diffuse thickening of the bladder wall which may  be partly related to underdistention. Cystitis is not excluded. Correlation with urinalysis recommended. Stomach/Bowel: There is moderate stool throughout the colon. There is scattered colonic diverticula without active inflammatory changes. There is no evidence of bowel obstruction or active inflammation. Normal appendix. Vascular/Lymphatic: There is mild aortoiliac atherosclerotic disease. The origins of the celiac axis, SMA, IMA and the renal arteries remain patent. The IVC appears  unremarkable. The SMV, splenic vein, and main portal vein are patent. No portal venous gas identified. There is no adenopathy. Reproductive: The prostate and seminal vesicles are grossly unremarkable. Other: None Musculoskeletal: Degenerative changes of the spine with disc desiccation and vacuum phenomena at L4-L5 and L5-S1. There is disc space narrowing and grade 1 retrolisthesis at L5-S1. No acute fractures. IMPRESSION: 1. Findings suspicious for pulmonary embolus involving the visualized right lower lobe pulmonary artery branch with associated areas of pulmonary infarcts. Further evaluation with CTA of the chest or V/Q scan recommended. Alternatively, but less likely, findings may represent pneumonia, possibly fungal in etiology with associated hilar lymphadenopathy and volume averaging artifact of the right lower lobe pulmonary artery branch. 2. No definite acute intra-abdominal or pelvic pathology. Mild bilateral perinephric stranding, likely chronic. Correlation with urinalysis recommended to exclude UTI. No hydronephrosis. 3. Colonic diverticulosis. No evidence of bowel obstruction or active inflammation. Normal appendix. These results were called by telephone at the time of interpretation on 03/08/2017 at 6:54 am to Dr. Lucrezia Europe , who verbally acknowledged these results. Electronically Signed   By: Elgie Collard M.D.   On: 03/08/2017 07:00   US Venous Img Lower Bilateral  Result Date: 03/08/2017 CLINICAL DATA:  Previous pulmonary embolus. EXAM: BILATERAL LOWER EXTREMITY VENOUS DUPLEX ULTRASOUND TECHNIQUE: Gray-scale sonography with graded compression, as well as color Doppler and duplex ultrasound were performed to evaluate the lower extremity deep venous systems from the level of the common femoral vein and including the common femoral, femoral, profunda femoral, popliteal and calf veins including the posterior tibial, peroneal and gastrocnemius veins when visible. The superficial great  saphenous vein was also interrogated. Spectral Doppler was utilized to evaluate flow at rest and with distal augmentation maneuvers in the common femoral, femoral and popliteal veins. COMPARISON:  None. FINDINGS: RIGHT LOWER EXTREMITY Common Femoral Vein: No evidence of thrombus. Normal compressibility, respiratory phasicity and response to augmentation. Saphenofemoral Junction: No evidence of thrombus. Normal compressibility and flow on color Doppler imaging. Profunda Femoral Vein: No evidence of thrombus. Normal compressibility and flow on color Doppler imaging. Femoral Vein: No evidence of thrombus. Normal compressibility, respiratory phasicity and response to augmentation. Popliteal Vein: No evidence of thrombus. Normal compressibility, respiratory phasicity and response to augmentation. Calf Veins: No evidence of thrombus. Normal compressibility and flow on color Doppler imaging. Superficial Great Saphenous Vein: No evidence of thrombus. Normal compressibility and flow on color Doppler imaging. Venous Reflux:  None. Other Findings:  None. LEFT LOWER EXTREMITY Common Femoral Vein: No evidence of thrombus. Normal compressibility, respiratory phasicity and response to augmentation. Saphenofemoral Junction: No evidence of thrombus. Normal compressibility and flow on color Doppler imaging. Profunda Femoral Vein: No evidence of thrombus. Normal compressibility and flow on color Doppler imaging. Femoral Vein: No evidence of thrombus. Normal compressibility, respiratory phasicity and response to augmentation. Popliteal Vein: No evidence of thrombus. Normal compressibility, respiratory phasicity and response to augmentation. Calf Veins: No evidence of thrombus. Normal compressibility and flow on color Doppler imaging. Superficial Great Saphenous Vein: No evidence of thrombus. Normal compressibility and flow on color Doppler imaging. Venous Reflux:  None.  Other Findings:  None. IMPRESSION: No evidence of deep venous  thrombosis in either lower extremity. Electronically Signed   By: Bretta Bang III M.D.   On: 03/08/2017 09:27    Scheduled Meds: . apixaban  10 mg Oral BID   Followed by  . [START ON 03/16/2017] apixaban  5 mg Oral BID  . [START ON 03/10/2017] azithromycin  250 mg Oral Daily  . donepezil  10 mg Oral q morning - 10a  . escitalopram  10 mg Oral Daily  . finasteride  5 mg Oral Daily  . insulin aspart  0-5 Units Subcutaneous QHS  . insulin aspart  0-9 Units Subcutaneous TID WC  . levETIRAcetam  250 mg Oral BID  . lisinopril  20 mg Oral Daily  . memantine  10 mg Oral BID  . QUEtiapine  25 mg Oral QHS  . tamsulosin  0.4 mg Oral QPC supper   Continuous Infusions: . cefTRIAXone (ROCEPHIN)  IV Stopped (03/09/17 0959)    Assessment/Plan:  1. Acute Pulmonary embolism with pulmonary infarct. Convert heparin drip over to Eliquis. Patient not having any pain at this point in time. Sonogram of the lower extremities negative for DVT. Stop Megace. Echocardiogram ordered. 2. Fever of 102. Unclear if pneumonia. Rocephin and started yesterday and Zithromax today just in case this is pneumonia. Sometimes people can have fever with pulmonary infarct. Urinalysis does look negative. 3. Dementia on Aricept and Namenda. Seroquel at night 4. Type 2 diabetes mellitus. Glucophage on hold secondary to CT scans. Patient on sliding scale 5. BPH on Flomax and finasteride 6. Seizure disorder on low-dose Keppra 7. Essential hypertension on lisinopril 8. Recommend repeating CT scan of the chest in 3-6 months for further evaluation of right hilum. 9. Looks like episodes of SVT seen on telemetry. Will start low dose Cardizem CD  Code Status:     Code Status Orders        Start     Ordered   03/08/17 0803  Full code  Continuous     03/08/17 0802    Code Status History    Date Active Date Inactive Code Status Order ID Comments User Context   This patient has a current code status but no historical code  status.     Family Communication: Spoke with wife on the phone Disposition Plan: Potentially home tomorrow if afebrile  Antibiotics:  Rocephin  Zithromax  Time spent: 26 minutes  Alford Highland  [employer]

## 2017-03-10 LAB — ECHOCARDIOGRAM COMPLETE
Area-P 1/2: 3.49 cm2
CHL CUP MV DEC (S): 215
E decel time: 215 msec
E/e' ratio: 9.52
FS: 51 % — AB (ref 28–44)
HEIGHTINCHES: 72 in
IV/PV OW: 1.13
LA ID, A-P, ES: 41 mm
LA diam end sys: 41 mm
LADIAMINDEX: 1.97 cm/m2
LAVOLA4C: 33.5 mL
LV E/e' medial: 9.52
LV E/e'average: 9.52
LV PW d: 8.77 mm — AB (ref 0.6–1.1)
LV TDI E'LATERAL: 7.29
LV TDI E'MEDIAL: 5.98
LVELAT: 7.29 cm/s
Lateral S' vel: 12.9 cm/s
MV pk A vel: 87.8 m/s
MV pk E vel: 69.4 m/s
P 1/2 time: 63 ms
TAPSE: 26.9 mm
WEIGHTICAEL: 2992 [oz_av]

## 2017-03-10 LAB — BASIC METABOLIC PANEL
Anion gap: 8 (ref 5–15)
BUN: 7 mg/dL (ref 6–20)
CALCIUM: 8.9 mg/dL (ref 8.9–10.3)
CO2: 21 mmol/L — AB (ref 22–32)
Chloride: 106 mmol/L (ref 101–111)
Creatinine, Ser: 0.74 mg/dL (ref 0.61–1.24)
GFR calc Af Amer: >60 mL/min (ref >60)
GLUCOSE: 152 mg/dL — AB (ref 65–99)
Potassium: 3.8 mmol/L (ref 3.5–5.1)
Sodium: 135 mmol/L (ref 135–145)

## 2017-03-10 LAB — GLUCOSE, CAPILLARY
Glucose-Capillary: 160 mg/dL — ABNORMAL HIGH (ref 65–99)
Glucose-Capillary: 203 mg/dL — ABNORMAL HIGH (ref 65–99)

## 2017-03-10 LAB — CBC
HCT: 26.8 % — ABNORMAL LOW (ref 40.0–52.0)
HEMOGLOBIN: 9.2 g/dL — AB (ref 13.0–18.0)
MCH: 29.7 pg (ref 26.0–34.0)
MCHC: 34.5 g/dL (ref 32.0–36.0)
MCV: 86.1 fL (ref 80.0–100.0)
PLATELETS: 348 10*3/uL (ref 150–440)
RBC: 3.11 MIL/uL — ABNORMAL LOW (ref 4.40–5.90)
RDW: 13.7 % (ref 11.5–14.5)
WBC: 6.5 10*3/uL (ref 3.8–10.6)

## 2017-03-10 MED ORDER — AMOXICILLIN-POT CLAVULANATE 500-125 MG PO TABS
1.0000 | ORAL_TABLET | Freq: Two times a day (BID) | ORAL | 0 refills | Status: AC
Start: 2017-03-10 — End: ?

## 2017-03-10 MED ORDER — DILTIAZEM HCL ER COATED BEADS 120 MG PO CP24: 120.0000 mg | ORAL_CAPSULE | Freq: Every day | ORAL | 0 refills | Status: AC

## 2017-03-10 MED ORDER — APIXABAN 5 MG PO TABS: ORAL_TABLET | ORAL | 0 refills | Status: AC

## 2017-03-10 NOTE — Progress Notes (Signed)
Ronald Davidson to be D/C'd Home per MD order.  Discussed with the patient and all questions fully answered.  VSS, Skin clean, dry and intact without evidence of skin break down, no evidence of skin tears noted. IV catheter discontinued intact. Site without signs and symptoms of complications. Dressing and pressure applied.  An After Visit Summary was printed and given to the patient. Patient received prescription.  D/c education completed with patient/family including follow up instructions, medication list, d/c activities limitations if indicated, with other d/c instructions as indicated by MD - patient able to verbalize understanding, all questions fully answered.   Patient instructed to return to ED, call 911, or call MD for any changes in condition.   Patient escorted via WC, and D/C home via private auto.  [name] 03/10/2017 3:34 PM

## 2017-03-10 NOTE — Discharge Instructions (Signed)
Can restart Janumet in two days Watch for any signs of bleeding Needs follow up ct scan chest in 3-6 months

## 2017-03-10 NOTE — Discharge Summary (Signed)
[facility] -  at [facility]   PATIENT NAME: Ronald Davidson    MR#:  [id number]  DATE OF BIRTH:  October 22, 1946  DATE OF ADMISSION:  03/08/2017 ADMITTING PHYSICIAN: Alford Highland, MD  DATE OF DISCHARGE: 03/10/2017  PRIMARY CARE PHYSICIAN: SPARKS,JEFFREY D, MD    ADMISSION DIAGNOSIS:  Pulmonary embolism (HCC) [I26.99] Right upper quadrant abdominal pain [R10.11] Pulmonary emboli (HCC) [I26.99] Other acute pulmonary embolism without acute cor pulmonale (HCC) [I26.99] Chest pain, unspecified type [R07.9]  DISCHARGE DIAGNOSIS:  Active Problems:   Pulmonary embolism (HCC)   SECONDARY DIAGNOSIS:   Past Medical History:  Diagnosis Date  . Chronic anemia   . Colon polyp   . DM (diabetes mellitus) (HCC)   . GERD (gastroesophageal reflux disease)   . GI bleed   . Hyperlipidemia   . Hypertension   . Leukopenia   . Migraine   . Neutropenia (HCC)   . Seizure [facility])     HOSPITAL COURSE:   1. Acute pulmonary embolism with pulmonary infarct. Patient was initially started on heparin drip and then converted over to Eliquis. Patient asymptomatic here in the hospital with regards to chest pain or shortness of breath. Sonogram of the lower extremities was negative for DVT. I stop the patient's Megace. Benefits and risks of blood thinner explained to the patient and the patient's wife. Risk of bleeding explained at length. I always get concerned when I prescribed blood thinner in a patient with dementia on their ambulatory ability and mental capacity. At this point with pulmonary embolism that was symptomatic on presentation I think we have to try the blood thinner. 2. Fever. This could be secondary to pneumonia or even pulmonary infarct. I did give antibiotics and will prescribe Augmentin upon going home. 3. Dementia on Aricept and Namenda. Seroquel at night 4. Type 2 diabetes mellitus. Janumet on hold secondary to CT scans can go back on this in a couple days 5. BPH  on Flomax and finasteride 6. Seizure disorder on low-dose Keppra 7. Essential hypertension placed on Cardizem CD 8. Brief episodes of SVT seen on telemetry. Low dose Cardizem CD started 9. Recommend checking a CT scan of the chest in 3 months because the radiologist commented on right hilar fullness and could not rule out underlying issues. 10. Chronic anemia follow up closely as outpatient.  DISCHARGE CONDITIONS:   Satisfactory  CONSULTS OBTAINED:   none  DRUG ALLERGIES:  No Known Allergies  DISCHARGE MEDICATIONS:   Current Discharge Medication List    START taking these medications   Details  amoxicillin-clavulanate (AUGMENTIN) 500-125 MG tablet Take 1 tablet (500 mg total) by mouth 2 (two) times daily. Qty: 10 tablet, Refills: 0    apixaban (ELIQUIS) 5 MG TABS tablet Two tablets twice a day for six day, then one tablet twice a day afterwards Qty: 72 tablet, Refills: 0    diltiazem (CARDIZEM CD) 120 MG 24 hr capsule Take 1 capsule (120 mg total) by mouth daily. Qty: 30 capsule, Refills: 0      CONTINUE these medications which have NOT CHANGED   Details  colchicine 0.6 MG tablet Take 2 tablets (1.2mg ) by mouth at first sign of gout flare followed by 1 tablet (0.6mg ) after 1 hour. (Max 1.8mg  within 1 hour)    cyanocobalamin (,VITAMIN B-12,) 1000 MCG/ML injection INJECT 1 CC INTRAMUSCULARLY ONCE A MONTH    donepezil (ARICEPT) 10 MG tablet Take 10 mg by mouth daily.     finasteride (PROSCAR) 5 MG tablet TAKE  1 TABLET BY MOUTH ONCE A DAY    levETIRAcetam (KEPPRA) 250 MG tablet Take 250 mg by mouth 2 (two) times daily.     memantine (NAMENDA) 10 MG tablet Take 10 mg by mouth 2 (two) times daily.     QUEtiapine (SEROQUEL) 25 MG tablet Take 25 mg by mouth at bedtime.    tamsulosin (FLOMAX) 0.4 MG CAPS capsule Take 0.4 mg by mouth daily.    Vitamin D, Ergocalciferol, (DRISDOL) 50000 units CAPS capsule Take 50,000 Units by mouth every 7 (seven) days.    escitalopram  (LEXAPRO) 10 MG tablet TAKE 1 TABLET BY MOUTH EVERY DAY    glucose blood (ACCU-CHEK COMPACT PLUS) test strip USE TO TEST EVERY 4 TO 6 HOURS AS DIRECTED ( 4 TIMES A DAY)    SYRINGE-NEEDLE, DISP, 3 ML (B-D 3CC LUER-LOK SYR 25GX1") 25G X 1" 3 ML MISC Inject into the muscle.      STOP taking these medications     aspirin EC 81 MG tablet      cefUROXime (CEFTIN) 250 MG tablet      lisinopril (PRINIVIL,ZESTRIL) 20 MG tablet      megestrol (MEGACE) 400 MG/10ML suspension      meloxicam (MOBIC) 7.5 MG tablet      sitaGLIPtin-metformin (JANUMET) 50-1000 MG tablet          DISCHARGE INSTRUCTIONS:   Follow-up PMD one week  If you experience worsening of your admission symptoms, develop shortness of breath, life threatening emergency, suicidal or homicidal thoughts you must seek medical attention immediately by calling 911 or calling your MD immediately  if symptoms less severe.  You Must read complete instructions/literature along with all the possible adverse reactions/side effects for all the Medicines you take and that have been prescribed to you. Take any new Medicines after you have completely understood and accept all the possible adverse reactions/side effects.   Please note  You were cared for by a hospitalist during your hospital stay. If you have any questions about your discharge medications or the care you received while you were in the hospital after you are discharged, you can call the unit and asked to speak with the hospitalist on call if the hospitalist that took care of you is not available. Once you are discharged, your primary care physician will handle any further medical issues. Please note that NO REFILLS for any discharge medications will be authorized once you are discharged, as it is imperative that you return to your primary care physician (or establish a relationship with a primary care physician if you do not have one) for your aftercare needs so that they can  reassess your need for medications and monitor your lab values.    Today   CHIEF COMPLAINT:  Chest pain and shortness breath  HISTORY OF PRESENT ILLNESS:  Ronald Davidson  is a 71 y.o. male presented with chest pain and shortness breath   VITAL SIGNS:  Blood pressure 138/73, pulse 82, temperature 98.4 F (36.9 C), temperature source Oral, resp. rate 19, height 6' (1.829 m), weight 84.8 kg (187 lb), SpO2 95 %.    PHYSICAL EXAMINATION:  GENERAL:  71 y.o.-year-old patient lying in the bed with no acute distress.  EYES: Pupils equal, round, reactive to light and accommodation. No scleral icterus. Extraocular muscles intact.  HEENT: Head atraumatic, normocephalic. Oropharynx and nasopharynx clear.  NECK:  Supple, no jugular venous distention. No thyroid enlargement, no tenderness.  LUNGS: Normal breath sounds bilaterally, no wheezing, rales,rhonchi or  crepitation. No use of accessory muscles of respiration.  CARDIOVASCULAR: S1, S2 normal. No murmurs, rubs, or gallops.  ABDOMEN: Soft, non-tender, non-distended. Bowel sounds present. No organomegaly or mass.  EXTREMITIES: No pedal edema, cyanosis, or clubbing.  NEUROLOGIC: Cranial nerves II through XII are intact. Muscle strength 5/5 in all extremities. Sensation intact. Gait not checked.  PSYCHIATRIC: The patient is alert and Answers questions appropriately.  SKIN: No obvious rash, lesion, or ulcer.   DATA REVIEW:   CBC  Recent Labs Lab 03/10/17 0433  WBC 6.5  HGB 9.2*  HCT 26.8*  PLT 348    Chemistries   Recent Labs Lab 03/08/17 0144  03/10/17 0433  NA 137  < > 135  K 3.6  < > 3.8  CL 107  < > 106  CO2 18*  < > 21*  GLUCOSE 187*  < > 152*  BUN 13  < > 7  CREATININE 1.12  < > 0.74  CALCIUM 9.3  < > 8.9  AST 21  --   --   ALT 16*  --   --   ALKPHOS 59  --   --   BILITOT 0.7  --   --   < > = values in this interval not displayed.  Cardiac Enzymes  Recent Labs Lab 03/08/17 0739  TROPONINI <0.03     Microbiology Results  Results for orders placed or performed during the hospital encounter of 03/08/17  Urine culture     Status: Abnormal   Collection Time: 03/08/17  7:39 AM  Result Value Ref Range Status   Specimen Description URINE, CLEAN CATCH  Final   Special Requests NONE  Final   Culture MULTIPLE SPECIES PRESENT, SUGGEST RECOLLECTION (A)  Final   Report Status 03/09/2017 FINAL  Final    RADIOLOGY:  Ct Angio Chest Pe W Or Wo Contrast  Result Date: 03/09/2017 CLINICAL DATA:  Possible pulmonary embolism on abdominal CT yesterday. Patient is already on a heparin drip. EXAM: CT ANGIOGRAPHY CHEST WITH CONTRAST TECHNIQUE: Multidetector CT imaging of the chest was performed using the standard protocol during bolus administration of intravenous contrast. Multiplanar CT image reconstructions and MIPs were obtained to evaluate the vascular anatomy. CONTRAST:  75 cc Isovue 370 intravenous COMPARISON:  Abdominal CT from yesterday FINDINGS: Cardiovascular: Multifocal (at least 6) bilateral pulmonary emboli. Some appear eccentric and remote appearing, others central and acute. The largest is a segmental size embolus is seen posteriorly in the right upper lobe. Negative for right heart strain. No cardiomegaly or pericardial effusion. Atherosclerosis of the aorta and coronaries. Mediastinum/Nodes: Asymmetric right hilar lymphadenopathy with nodes measuring up to 14 mm. Borderline mediastinal adenopathy. No left hilar adenopathy. Lungs/Pleura: Bandlike areas of consolidative opacity in the right more than left lung bases, primarily lower lobes but also in the lingula. These areas show volume loss and bronchial crowding. No pulmonary edema or pleural effusion. Upper Abdomen: No acute finding Musculoskeletal: No acute or aggressive finding Review of the MIP images confirms the above findings. IMPRESSION: 1. Confirmed multifocal acute pulmonary emboli, up to segmental size. There have likely been remote  emboli to the lower lobes. Negative for right heart strain. 2. Multifocal lung infarct or atelectasis. 3. Asymmetric right hilar adenopathy of indeterminate cause, correlate for pneumonia symptoms in this patient with asymmetric right-sided lung opacity. Cannot exclude neoplasm, if appropriate for comorbidities three-month follow-up CT could evaluate for resolution or stability. Electronically Signed   By: Marnee Spring M.D.   On: 03/09/2017 09:06  Management plans discussed with the patient, family and they are in agreement.  CODE STATUS:     Code Status Orders        Start     Ordered   03/08/17 0803  Full code  Continuous     03/08/17 0802    Code Status History    Date Active Date Inactive Code Status Order ID Comments User Context   This patient has a current code status but no historical code status.      TOTAL TIME TAKING CARE OF THIS PATIENT: 35 minutes.    Alford Highland M.D on 03/10/2017 at 4:22 PM  Between 7am to 6pm - Pager - [phone]  After 6pm go to [email or web address] - password [email or web address]  [employer] Office  [phone]  CC: Primary care physician; Marguarite Arbour, MD

## 2017-03-10 NOTE — Care Management Important Message (Signed)
Important Message  Patient Details  Name: Ronald Davidson MRN: [id number] Date of Birth: 09-27-1946   Medicare Important Message Given:  Yes    Marily Memos, RN 03/10/2017, 10:10 AM

## 2017-03-10 NOTE — Progress Notes (Signed)
Patient's wife states that patient does not take escitalopram, requests it not be given. Patient to ECHO this shift. NSR on telemetry. Alert and oriented to self, confusion to place, time and situation. Up to bathroom with gait belt, contact guard assist. No complaints this shift. Nursing continues to monitor and assist as needed.

## 2017-03-10 NOTE — Care Management (Signed)
This RNCM called to unit as patient states he lost his Eliquis coupon. I looked in room and could not find it. I have printed a new coupon from Eliquis and also from Rx Pharmacy Card - both delivered to patient. No further RNCM needs.

## 2017-03-10 NOTE — Care Management Note (Signed)
Case Management Note  Patient Details  Name: Ronald Davidson MRN: [id number] Date of Birth: Mar 18, 1946  Subjective/Objective:   Spoke with wife regarding PT recommendations.  She is agreeable to home PT. No agency preference. [facility] with care by his wife.                Action/Plan:   Expected Discharge Date:  03/10/17               Expected Discharge Plan:  Home w Home Health Services  In-House Referral:     Discharge planning Services  CM Consult  Post Acute Care Choice:  Home Health, Durable Medical Equipment Choice offered to:  Adult Children  DME Arranged:  Walker rolling DME Agency:  [facility]  HH Arranged:  PT HH Agency:  [facility]  Status of Service:  Completed, signed off  If discussed at Long Length of Stay Meetings, dates discussed:    Additional Comments:  Marily Memos, RN 03/10/2017, 10:47 AM

## 2017-03-10 NOTE — Evaluation (Signed)
Physical Therapy Evaluation Patient Details Name: Ronald Davidson MRN: [id number] DOB: 1946-01-24 Today's Date: 03/10/2017   History of Present Illness  Pt admitted for complaints of R side pain, noted to have +PE. Has now been on anticoag for 48hrs. History includes dementia, DM, GERD, HTN, and seizure.   Clinical Impression  Pt is a pleasant 71 year old male who was admitted for PE. Pt performs bed mobility with independence, transfers with mod I, and ambulation with min assist and no AD. Further ambulation performed with RW with improved balance and only required cga. Pt demonstrates deficits with strength/endurance/balance. Pt is very high risk for falls and needs to use RW at all time. Educated on use of RW and correct technique. Would benefit from skilled PT to address above deficits and promote optimal return to PLOF. Recommend transition to HHPT upon discharge from acute hospitalization.       Follow Up Recommendations Home health PT    Equipment Recommendations  Rolling walker with 5" wheels    Recommendations for Other Services       Precautions / Restrictions Precautions Precautions: Fall Restrictions Weight Bearing Restrictions: No      Mobility  Bed Mobility Overal bed mobility: Independent             General bed mobility comments: safe technique performed with ease of transfer.  Transfers Overall transfer level: Modified independent Equipment used: None             General transfer comment: upright posture noted with no AD  Ambulation/Gait Ambulation/Gait assistance: Min assist Ambulation Distance (Feet): 80 Feet Assistive device: None Gait Pattern/deviations: Decreased step length - left;Shuffle     General Gait Details: ambulated with unsteadiness and reaching out for railing with R hand. Decreased step length noted on L foot. Multiple LOB noted with min assist for correction.  Stairs            Wheelchair Mobility    Modified  Rankin (Stroke Patients Only)       Balance Overall balance assessment: Needs assistance Sitting-balance support: Feet supported Sitting balance-Leahy Scale: Normal     Standing balance support: During functional activity Standing balance-Leahy Scale: Fair                               Pertinent Vitals/Pain Pain Assessment: No/denies pain    Home Living Family/patient expects to be discharged to:: Private residence Living Arrangements: Spouse/significant other Available Help at Discharge: Family Type of Home: House Home Access: Stairs to enter Entrance Stairs-Rails: Can reach both Entrance Stairs-Number of Steps: 5 Home Layout: Multi-level;Bed/bath upstairs Home Equipment: Walker - standard;Cane - single point      Prior Function Level of Independence: Independent         Comments: Pt reports no falls and reports he was independent at home     Hand Dominance        Extremity/Trunk Assessment   Upper Extremity Assessment Upper Extremity Assessment: Overall WFL for tasks assessed    Lower Extremity Assessment Lower Extremity Assessment: Generalized weakness (B LE grossly 4/5)       Communication   Communication: No difficulties  Cognition Arousal/Alertness: Awake/alert Behavior During Therapy: WFL for tasks assessed/performed Overall Cognitive Status: Within Functional Limits for tasks assessed  General Comments      Exercises Other Exercises Other Exercises: Supine/seated ther-ex performed including LAQ, SLRs, and hip abd/add. All ther-ex performed x 10 reps with cga. Safe technique performed Other Exercises: Pt ambulated additional 27' with RW and cga. Improved balance noted with no LOB, however unsteadiness noted during turns. Pt cued for sequencing of RW and keeping close to body. Still demonstrates decreased step length on L side.   Assessment/Plan    PT Assessment Patient  needs continued PT services  PT Problem List Decreased strength;Decreased balance;Decreased mobility;Decreased knowledge of use of DME;Decreased safety awareness       PT Treatment Interventions Gait training;DME instruction;Stair training;Therapeutic exercise    PT Goals (Current goals can be found in the Care Plan section)  Acute Rehab PT Goals Patient Stated Goal: to go home today PT Goal Formulation: With patient Time For Goal Achievement: 03/24/17 Potential to Achieve Goals: Good    Frequency Min 2X/week   Barriers to discharge        Co-evaluation               AM-PAC PT "6 Clicks" Daily Activity  Outcome Measure Difficulty turning over in bed (including adjusting bedclothes, sheets and blankets)?: None Difficulty moving from lying on back to sitting on the side of the bed? : None Difficulty sitting down on and standing up from a chair with arms (e.g., wheelchair, bedside commode, etc,.)?: None Help needed moving to and from a bed to chair (including a wheelchair)?: A Little Help needed walking in hospital room?: A Little Help needed climbing 3-5 steps with a railing? : A Little 6 Click Score: 21    End of Session Equipment Utilized During Treatment: Gait belt Activity Tolerance: Patient tolerated treatment well Patient left: in chair;with chair alarm set Nurse Communication: Mobility status PT Visit Diagnosis: Unsteadiness on feet (R26.81);Muscle weakness (generalized) (M62.81);Difficulty in walking, not elsewhere classified (R26.2)    Time: 1610-9604 PT Time Calculation (min) (ACUTE ONLY): 30 min   Charges:   PT Evaluation $PT Eval Low Complexity: 1 Procedure PT Treatments $Gait Training: 8-22 mins $Therapeutic Exercise: 8-22 mins   PT G Codes:        Elizabeth Palau, PT, DPT [phone]   Ronald Davidson 03/10/2017, 10:39 AM

## 2017-03-31 ENCOUNTER — Other Ambulatory Visit: Payer: Self-pay | Admitting: Internal Medicine

## 2017-03-31 DIAGNOSIS — R4189 Other symptoms and signs involving cognitive functions and awareness: Secondary | ICD-10-CM

## 2017-03-31 DIAGNOSIS — R404 Transient alteration of awareness: Principal | ICD-10-CM

## 2017-04-14 ENCOUNTER — Ambulatory Visit: Payer: Medicare Other

## 2017-04-16 ENCOUNTER — Ambulatory Visit
Admission: RE | Admit: 2017-04-16 | Discharge: 2017-04-16 | Disposition: A | Discharge status: Home / Self Care | Payer: Medicare Other | Source: Ambulatory Visit | Attending: Internal Medicine | Admitting: Internal Medicine

## 2017-04-16 DIAGNOSIS — I6782 Cerebral ischemia: Secondary | ICD-10-CM | POA: Diagnosis not present

## 2017-04-16 DIAGNOSIS — G319 Degenerative disease of nervous system, unspecified: Secondary | ICD-10-CM | POA: Insufficient documentation

## 2017-04-16 DIAGNOSIS — R404 Transient alteration of awareness: Secondary | ICD-10-CM | POA: Insufficient documentation

## 2017-04-16 DIAGNOSIS — R4189 Other symptoms and signs involving cognitive functions and awareness: Secondary | ICD-10-CM

## 2017-06-20 ENCOUNTER — Emergency Department
Admission: EM | Admit: 2017-06-20 | Discharge: 2017-06-20 | Disposition: A | Discharge status: Home / Self Care | Payer: Medicare Other | Attending: Emergency Medicine | Admitting: Emergency Medicine

## 2017-06-20 ENCOUNTER — Emergency Department: Payer: Medicare Other

## 2017-06-20 DIAGNOSIS — R569 Unspecified convulsions: Secondary | ICD-10-CM | POA: Insufficient documentation

## 2017-06-20 DIAGNOSIS — E119 Type 2 diabetes mellitus without complications: Secondary | ICD-10-CM | POA: Diagnosis not present

## 2017-06-20 DIAGNOSIS — Z79899 Other long term (current) drug therapy: Secondary | ICD-10-CM | POA: Insufficient documentation

## 2017-06-20 DIAGNOSIS — I10 Essential (primary) hypertension: Secondary | ICD-10-CM | POA: Insufficient documentation

## 2017-06-20 DIAGNOSIS — R55 Syncope and collapse: Secondary | ICD-10-CM | POA: Diagnosis present

## 2017-06-20 DIAGNOSIS — Z87891 Personal history of nicotine dependence: Secondary | ICD-10-CM | POA: Diagnosis not present

## 2017-06-20 DIAGNOSIS — Z7901 Long term (current) use of anticoagulants: Secondary | ICD-10-CM | POA: Insufficient documentation

## 2017-06-20 DIAGNOSIS — R531 Weakness: Secondary | ICD-10-CM | POA: Diagnosis not present

## 2017-06-20 DIAGNOSIS — Z794 Long term (current) use of insulin: Secondary | ICD-10-CM | POA: Diagnosis not present

## 2017-06-20 LAB — CBC
HCT: 33.7 % — ABNORMAL LOW (ref 40.0–52.0)
Hemoglobin: 11.2 g/dL — ABNORMAL LOW (ref 13.0–18.0)
MCH: 27.1 pg (ref 26.0–34.0)
MCHC: 33.2 g/dL (ref 32.0–36.0)
MCV: 81.5 fL (ref 80.0–100.0)
PLATELETS: 262 10*3/uL (ref 150–440)
RBC: 4.13 MIL/uL — ABNORMAL LOW (ref 4.40–5.90)
RDW: 14.9 % — AB (ref 11.5–14.5)
WBC: 6.1 10*3/uL (ref 3.8–10.6)

## 2017-06-20 LAB — BASIC METABOLIC PANEL
Anion gap: 9 (ref 5–15)
BUN: 11 mg/dL (ref 6–20)
CHLORIDE: 104 mmol/L (ref 101–111)
CO2: 26 mmol/L (ref 22–32)
CREATININE: 1.03 mg/dL (ref 0.61–1.24)
Calcium: 9.4 mg/dL (ref 8.9–10.3)
GFR calc Af Amer: >60 mL/min (ref >60)
GFR calc non Af Amer: >60 mL/min (ref >60)
GLUCOSE: 147 mg/dL — AB (ref 65–99)
Potassium: 3.5 mmol/L (ref 3.5–5.1)
Sodium: 139 mmol/L (ref 135–145)

## 2017-06-20 LAB — URINALYSIS, COMPLETE (UACMP) WITH MICROSCOPIC
BACTERIA UA: NONE SEEN
Bilirubin Urine: NEGATIVE
Glucose, UA: NEGATIVE mg/dL
KETONES UR: NEGATIVE mg/dL
Leukocytes, UA: NEGATIVE
Nitrite: NEGATIVE
PH: 5.5 (ref 5.0–8.0)
Protein, ur: >300 mg/dL — AB
Specific Gravity, Urine: 1.02 (ref 1.005–1.030)

## 2017-06-20 MED ORDER — LEVETIRACETAM 500 MG PO TABS
1000.0000 mg | ORAL_TABLET | Freq: Once | ORAL | Status: AC
  Administered 2017-06-20: 1000 mg via ORAL
  Filled 2017-06-20: qty 2

## 2017-06-20 NOTE — ED Triage Notes (Signed)
Pt presents to ED via POV with his wife. Pt's wife reports that she told him to sit down and put his shoes on and he made a noise. Pt's wife reports that she tried to get him to respond but he would not respond and would only move his mouth, she reports that he kept "falling asleep", and that she put "cold water and rubbed his face" to wake him up. Pt's wife reports hx of dementia, pt reports that he does not remember this episode. Pt's wife reports episode last approx 5 mins. Facial symmetry intact, grip strength equal bilateral, pt denies numbness or tingling, pt is alert, oriented to place, person, and situation, pt's wife reports this is his baseline.

## 2017-06-20 NOTE — ED Provider Notes (Signed)
[facility] Emergency Department Provider Note  Time seen: 1:19 PM  I have reviewed the triage vital signs and the nursing notes.   HISTORY  Chief Complaint Weakness and Loss of Consciousness    HPI Ronald Davidson is a 71 y.o. male with a past medical history of anemia, gastric reflux, hypertension, hyperlipidemia, seizure disorder on Keppra, dementia, presents to the emergency department after an episode this morning. Wife states the patient was getting ready when she heard what sounded like snoring. She went to check on the patient who appeared to be asleep but he was smacking and moving his lips wife states this lasted 5 or more minutes. Then the patient stopped moving but remained asleep. She finally was able to wake the patient, but the wife says he was very confused which lasted for 20 or 30 minutes. Here the patient does not recall the episode. Wife states the patient is on Keppra twice daily for seizure disorder but his typical seizures are generalized jerking. Patient denies any complaints at this time. Wife states the patient had not yet received his morning Keppra.    Past Medical History:  Diagnosis Date  . Chronic anemia   . Colon polyp   . DM (diabetes mellitus) (HCC)   . GERD (gastroesophageal reflux disease)   . GI bleed   . Hyperlipidemia   . Hypertension   . Leukopenia   . Migraine   . Neutropenia (HCC)   . Seizure [facility])     Patient Active Problem List   Diagnosis Date Noted  . Pulmonary embolism (HCC) 03/08/2017  . Seizures (HCC) 01/16/2017  . Upper GI bleeding 01/16/2017  . Late onset Alzheimer's disease without behavioral disturbance 12/14/2015  . Leukopenia 05/10/2015  . Anemia 02/13/2014  . Colon polyp 02/13/2014  . GERD (gastroesophageal reflux disease) 02/13/2014  . HTN (hypertension) 02/13/2014  . Migraine headache 02/13/2014  . Pure hypercholesterolemia 02/13/2014  . Type 2 diabetes mellitus without complication (HCC)  02/13/2014    Past Surgical History:  Procedure Laterality Date  . NO PAST SURGERIES      Prior to Admission medications   Medication Sig Start Date End Date Taking? Authorizing Provider  amoxicillin-clavulanate (AUGMENTIN) 500-125 MG tablet Take 1 tablet (500 mg total) by mouth 2 (two) times daily. 03/10/17   Alford Highland, MD  apixaban (ELIQUIS) 5 MG TABS tablet Two tablets twice a day for six day, then one tablet twice a day afterwards 03/10/17   Alford Highland, MD  colchicine 0.6 MG tablet Take 2 tablets (1.2mg ) by mouth at first sign of gout flare followed by 1 tablet (0.6mg ) after 1 hour. (Max 1.8mg  within 1 hour) 04/05/15   [provider]  cyanocobalamin (,VITAMIN B-12,) 1000 MCG/ML injection INJECT 1 CC INTRAMUSCULARLY ONCE A MONTH 07/13/15   [provider]  diltiazem (CARDIZEM CD) 120 MG 24 hr capsule Take 1 capsule (120 mg total) by mouth daily. 03/10/17   Alford Highland, MD  donepezil (ARICEPT) 10 MG tablet Take 10 mg by mouth daily.  12/07/15   [provider]  escitalopram (LEXAPRO) 10 MG tablet TAKE 1 TABLET BY MOUTH EVERY DAY 06/02/16   [provider]  finasteride (PROSCAR) 5 MG tablet TAKE 1 TABLET BY MOUTH ONCE A DAY 07/06/16   [provider]  glucose blood (ACCU-CHEK COMPACT PLUS) test strip USE TO TEST EVERY 4 TO 6 HOURS AS DIRECTED ( 4 TIMES A DAY) 10/08/15   [provider]  levETIRAcetam (KEPPRA) 250 MG tablet  Take 250 mg by mouth 2 (two) times daily.  12/07/15   [provider]  memantine (NAMENDA) 10 MG tablet Take 10 mg by mouth 2 (two) times daily.     [provider]  QUEtiapine (SEROQUEL) 25 MG tablet Take 25 mg by mouth at bedtime.    [provider]  SYRINGE-NEEDLE, DISP, 3 ML (B-D 3CC LUER-LOK SYR 25GX1") 25G X 1" 3 ML MISC Inject into the muscle. 02/29/16   [provider]  tamsulosin (FLOMAX) 0.4 MG CAPS capsule Take 0.4 mg by mouth daily. 03/03/17   [provider]   Vitamin D, Ergocalciferol, (DRISDOL) 50000 units CAPS capsule Take 50,000 Units by mouth every 7 (seven) days.    [provider]    No Known Allergies  Family History  Problem Relation Age of Onset  . Colon cancer Mother   . Diabetes Father     Social History Social History  Substance Use Topics  . Smoking status: Former Smoker    Packs/day: 0.50    Years: 30.00    Types: Cigarettes    Quit date: 01/17/1999  . Smokeless tobacco: Never Used  . Alcohol use No    Review of Systems Constitutional: Negative for fever. Cardiovascular: Negative for chest pain. Respiratory: Negative for shortness of breath.Wife states occasional cough over the last several days.  Gastrointestinal: Negative for abdominal pain, vomiting  Genitourinary: Negative for dysuria. Neurological: Negative for headache All other ROS negative  ____________________________________________   PHYSICAL EXAM:  VITAL SIGNS: ED Triage Vitals [06/20/17 1230]  Enc Vitals Group     BP (!) 163/84     Pulse Rate 60     Resp 18     Temp 97.8 F (36.6 C)     Temp Source Oral     SpO2 99 %     Weight 186 lb (84.4 kg)     Height 6' (1.829 m)     Head Circumference      Peak Flow      Pain Score      Pain Loc      Pain Edu?      Excl. in GC?     Constitutional: Alert and oriented. Well appearing and in no distress. Eyes: Normal exam ENT   Head: Normocephalic and atraumatic.   Mouth/Throat: Mucous membranes are moist. Cardiovascular: Normal rate, regular rhythm. No murmur Respiratory: Normal respiratory effort without tachypnea nor retractions. Breath sounds are clear Gastrointestinal: Soft and nontender. No distention.  Musculoskeletal: Nontender with normal range of motion in all extremities.  Neurologic:  Normal speech and language. No gross focal neurologic deficits. Equal grip strengths. No obvious deficits identified  Skin:  Skin is warm, dry and intact.  Psychiatric: Mood and  affect are normal.   ____________________________________________    RADIOLOGY  X-ray negative  ____________________________________________   INITIAL IMPRESSION / ASSESSMENT AND PLAN / ED COURSE  Pertinent labs & imaging results that were available during my care of the patient were reviewed by me and considered in my medical decision making (see chart for details).  Patient presents to the emergency department with symptoms suggestive of a seizure. Patient appeared to have an episode of unresponsiveness with lip smacking followed by confusion/postictal period. Patient had not yet received his morning Keppra. We will load with 1000 mg of oral Keppra in the emergency department. We will check labs, chest x-ray given recent cough and closely monitor. Overall the patient appears very well, no distress, normal physical and neurological  exam.   X-rays negative. Labs are largely baseline. Patient remains well appearing in the emergency department. Suspect likely seizure. Patient will follow-up with neurology on Monday. I discussed continuing home Keppra. Patient wife are agreeable. Discussed return precautions as well.  ____________________________________________   FINAL CLINICAL IMPRESSION(S) / ED DIAGNOSES  Seizure    Minna Antis, MD 06/20/17 1440

## 2017-06-20 NOTE — ED Notes (Signed)
Patient transported to X-ray 

## 2017-06-20 NOTE — ED Notes (Signed)
Pt hypertensive, MD instructed to proceed with discharge.

## 2017-08-30 IMAGING — CT CT ANGIO CHEST
2 of 7 series · 18 of 36 positions shown · IV contrast (APPLIED)
Comparison: Abdominal CT from yesterday

CLINICAL DATA: Possible pulmonary embolism on abdominal CT
yesterday. Patient is already on a heparin drip.

EXAM:
CT ANGIOGRAPHY CHEST WITH CONTRAST
TECHNIQUE: Multidetector CT imaging of the chest was performed using the
standard protocol during bolus administration of intravenous
contrast. Multiplanar CT image reconstructions and MIPs were
obtained to evaluate the vascular anatomy.
CONTRAST:  75 cc Isovue 370 intravenous

[Series 5: thins · axial · 0.66mm/px · z∈[-723,-515]mm · 15 of 238 slices shown]
[im 15/238  lung]
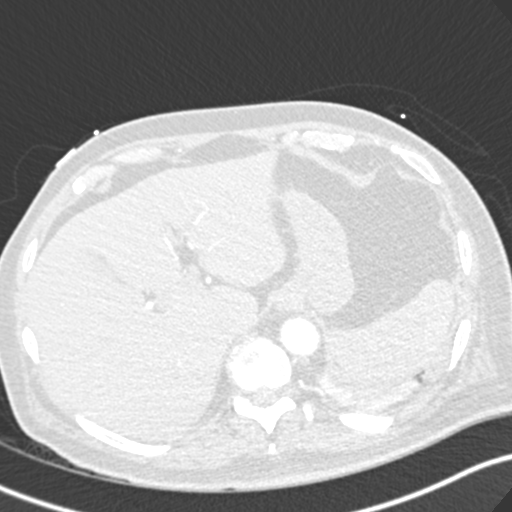
[im 30/238  mediastinal]
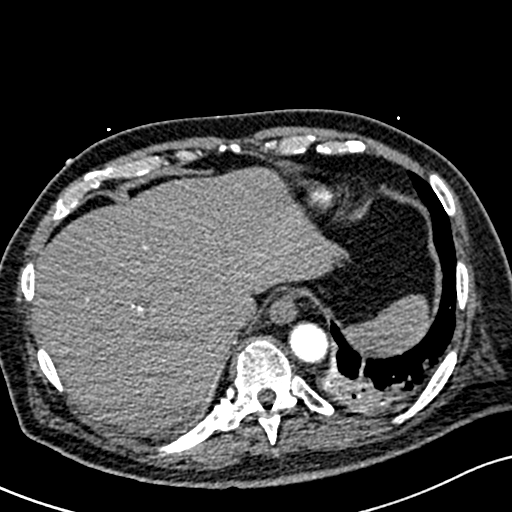
[im 45/238  lung]
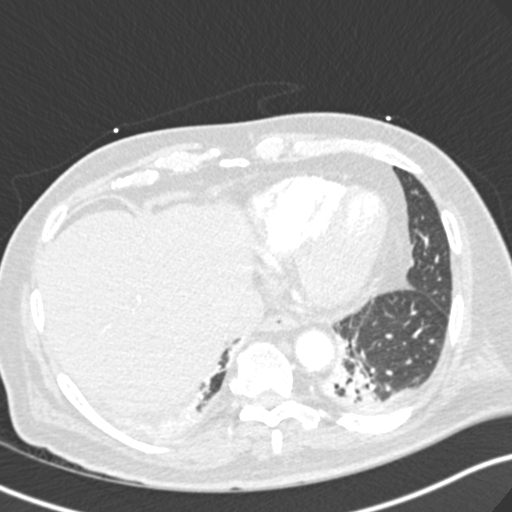
[im 60/238  mediastinal]
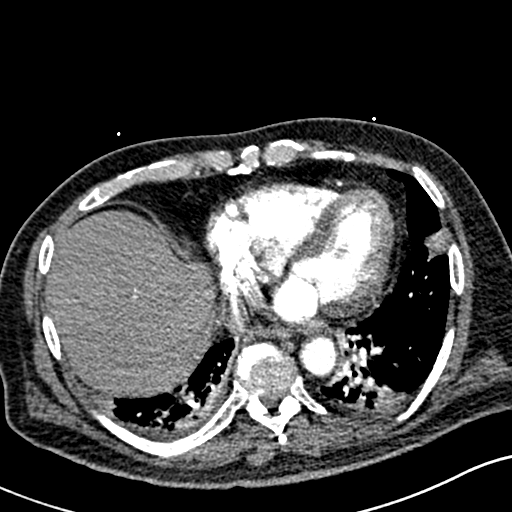
[im 75/238  lung]
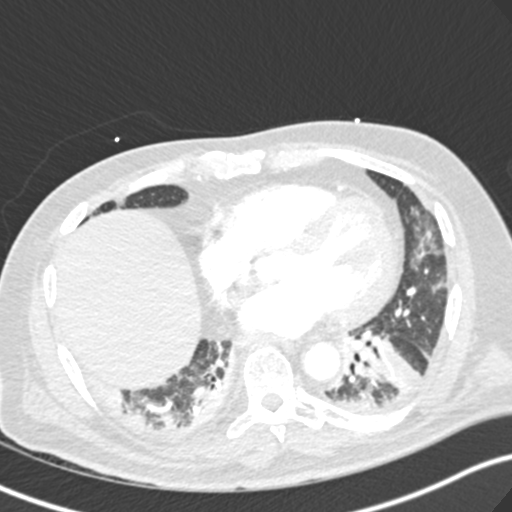
[im 89/238  mediastinal]
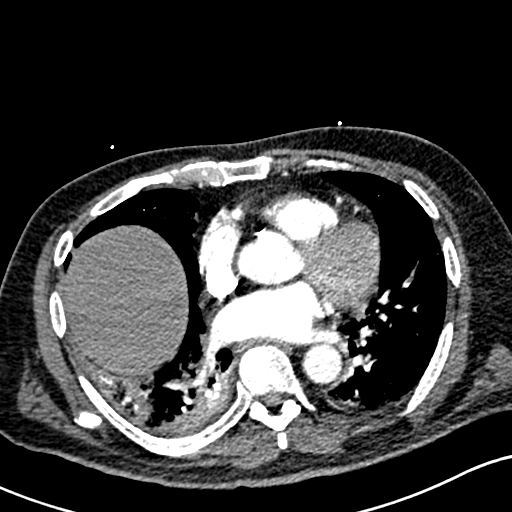
[im 104/238  lung]
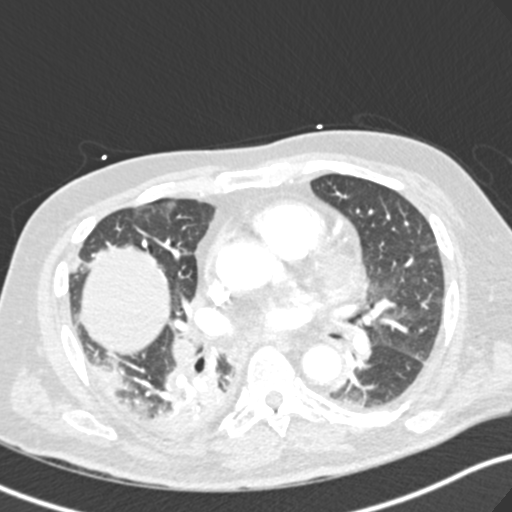
[im 119/238  mediastinal]
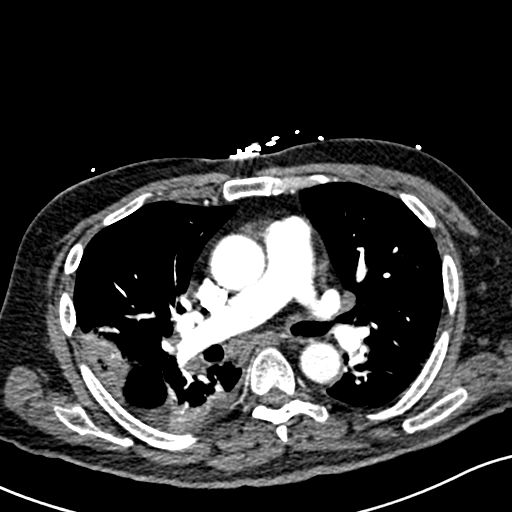
[im 134/238  lung]
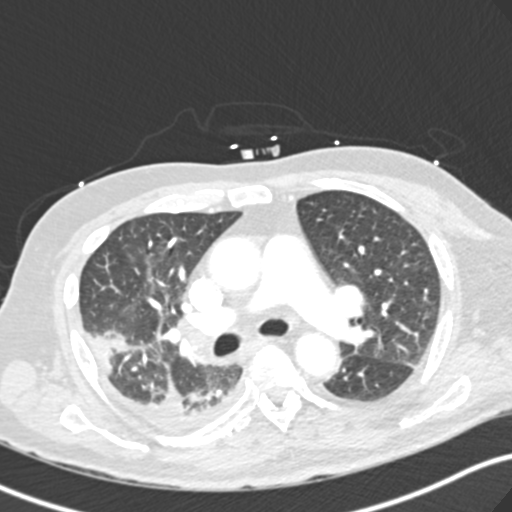
[im 149/238  mediastinal]
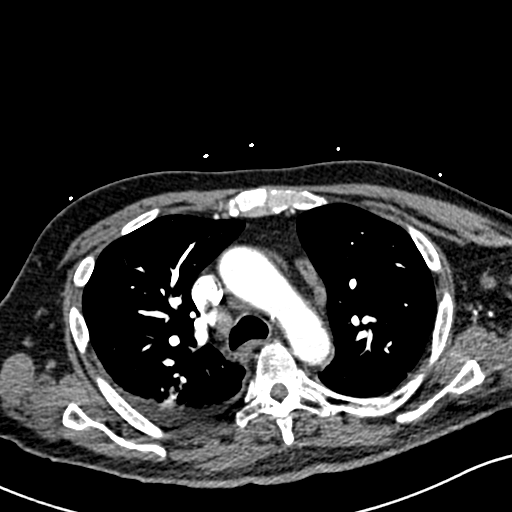
[im 163/238  lung]
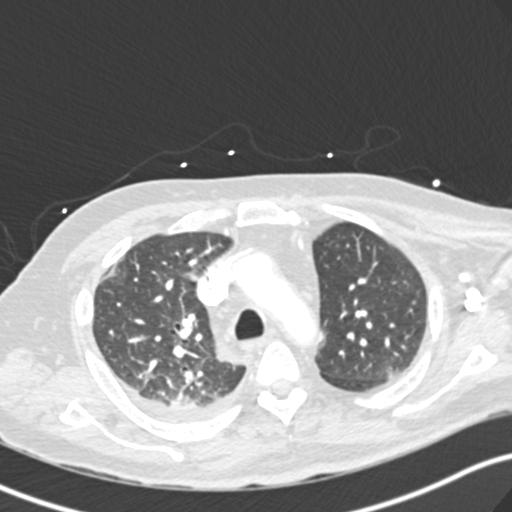
[im 178/238  mediastinal]
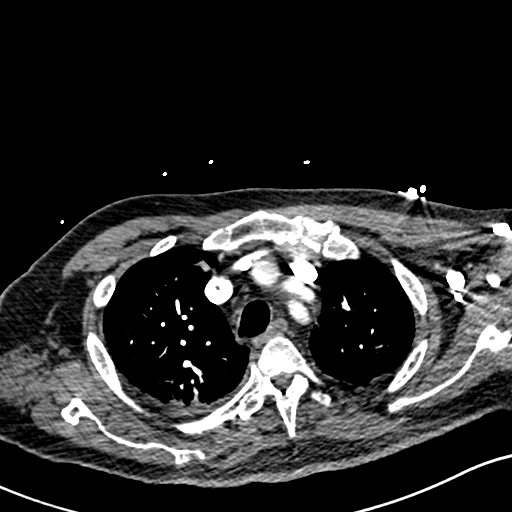
[im 193/238  lung]
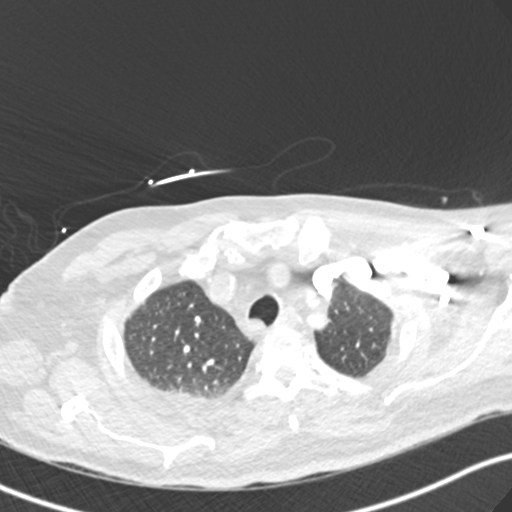
[im 208/238  mediastinal]
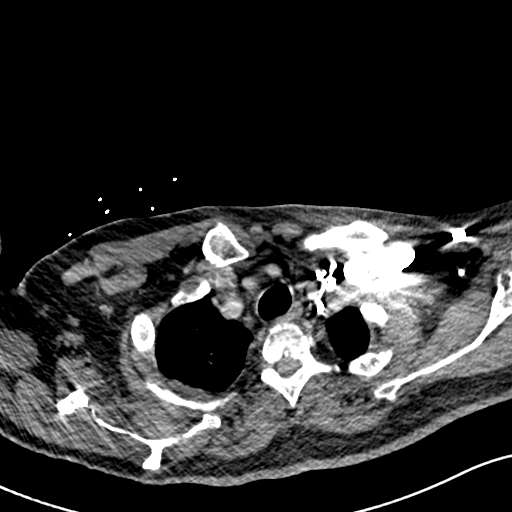
[im 223/238  lung]
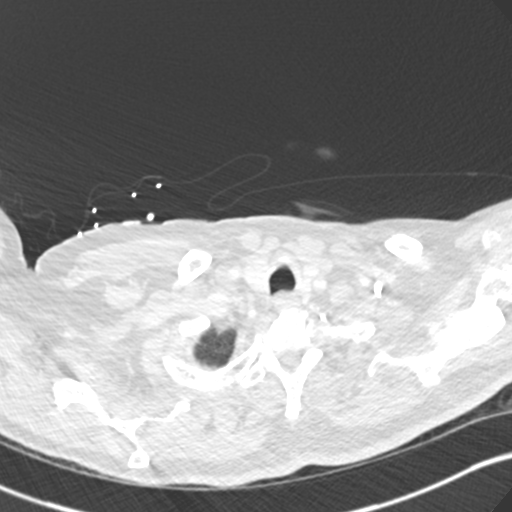

[Series 6: lung · axial · 0.54mm/px · z∈[-679,-565]mm · 3 of 77 slices shown]
[im 20/77  mediastinal]
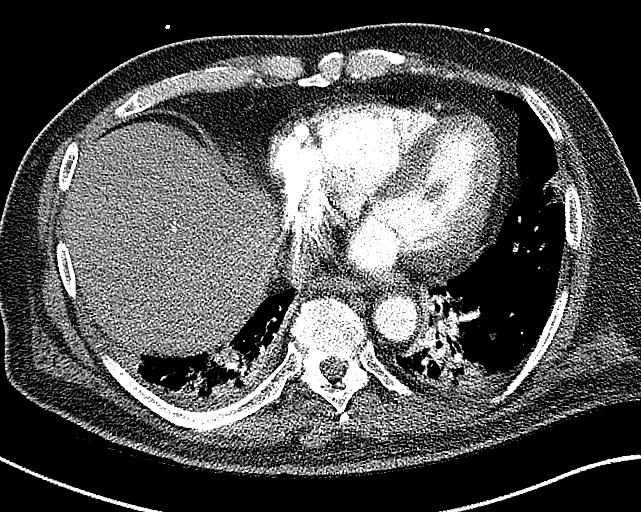
[im 39/77  mediastinal]
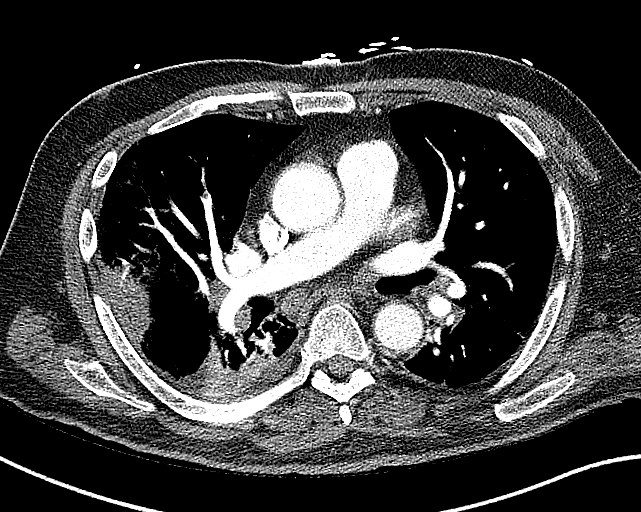
[im 58/77  mediastinal]
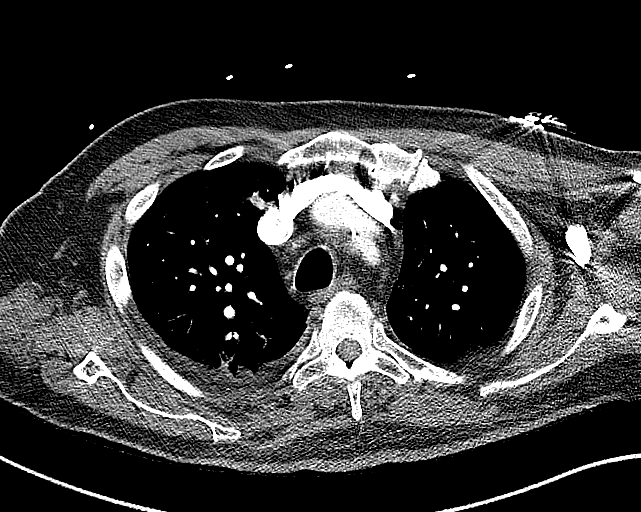

[18 of 36 positions shown; findings below may reference images not displayed]

FINDINGS: Cardiovascular: Multifocal (at least 6) bilateral pulmonary emboli.
Some appear eccentric and remote appearing, others central and
acute. The largest is a segmental size embolus is seen posteriorly
in the right upper lobe. Negative for right heart strain.

No cardiomegaly or pericardial effusion. Atherosclerosis of the
aorta and coronaries.

Mediastinum/Nodes: Asymmetric right hilar lymphadenopathy with nodes
measuring up to 14 mm. Borderline mediastinal adenopathy. No left
hilar adenopathy.

Lungs/Pleura: Bandlike areas of consolidative opacity in the right
more than left lung bases, primarily lower lobes but also in the
lingula. These areas show volume loss and bronchial crowding. No
pulmonary edema or pleural effusion.

Upper Abdomen: No acute finding

Musculoskeletal: No acute or aggressive finding

Review of the MIP images confirms the above findings.
IMPRESSION: 1. Confirmed multifocal acute pulmonary emboli, up to segmental
size. There have likely been remote emboli to the lower lobes.
Negative for right heart strain.
2. Multifocal lung infarct or atelectasis.
3. Asymmetric right hilar adenopathy of indeterminate cause,
correlate for pneumonia symptoms in this patient with asymmetric
right-sided lung opacity. Cannot exclude neoplasm, if appropriate
for comorbidities three-month follow-up CT could evaluate for
resolution or stability.

## 2019-07-12 DEATH — deceased
